# Patient Record
Sex: Male | Born: 1965 | Race: White | Hispanic: No | Marital: Married | Smoking: Never smoker
Health system: Southern US, Community
[De-identification: ages and names within clinical notes are randomized; demographics above are authoritative.]

## PROBLEM LIST (undated history)

## (undated) DIAGNOSIS — K219 Gastro-esophageal reflux disease without esophagitis: Secondary | ICD-10-CM

## (undated) DIAGNOSIS — I10 Essential (primary) hypertension: Secondary | ICD-10-CM

## (undated) DIAGNOSIS — N200 Calculus of kidney: Secondary | ICD-10-CM

## (undated) DIAGNOSIS — E7800 Pure hypercholesterolemia, unspecified: Secondary | ICD-10-CM

## (undated) DIAGNOSIS — I2510 Atherosclerotic heart disease of native coronary artery without angina pectoris: Secondary | ICD-10-CM

## (undated) HISTORY — PX: COLONOSCOPY: SHX174

## (undated) HISTORY — PX: UPPER GASTROINTESTINAL ENDOSCOPY: SHX188

## (undated) HISTORY — DX: Atherosclerotic heart disease of native coronary artery without angina pectoris: I25.10

## (undated) HISTORY — PX: NOSE SURGERY: SHX723

---

## 1997-11-20 ENCOUNTER — Emergency Department (HOSPITAL_COMMUNITY): Admission: EM | Admit: 1997-11-20 | Discharge: 1997-11-20 | Payer: Self-pay

## 1997-11-24 ENCOUNTER — Emergency Department (HOSPITAL_COMMUNITY): Admission: EM | Admit: 1997-11-24 | Discharge: 1997-11-24 | Payer: Self-pay

## 2001-01-31 ENCOUNTER — Encounter: Payer: Self-pay | Admitting: Family Medicine

## 2001-01-31 ENCOUNTER — Ambulatory Visit (HOSPITAL_COMMUNITY): Admission: RE | Admit: 2001-01-31 | Discharge: 2001-01-31 | Payer: Self-pay | Admitting: Family Medicine

## 2002-12-19 ENCOUNTER — Emergency Department (HOSPITAL_COMMUNITY): Admission: EM | Admit: 2002-12-19 | Discharge: 2002-12-19 | Payer: Self-pay | Admitting: Emergency Medicine

## 2004-01-21 ENCOUNTER — Ambulatory Visit: Payer: Self-pay | Admitting: Internal Medicine

## 2004-01-28 ENCOUNTER — Ambulatory Visit: Payer: Self-pay | Admitting: Internal Medicine

## 2004-02-05 ENCOUNTER — Ambulatory Visit: Payer: Self-pay | Admitting: Internal Medicine

## 2004-02-11 ENCOUNTER — Ambulatory Visit: Payer: Self-pay | Admitting: Internal Medicine

## 2004-02-18 ENCOUNTER — Ambulatory Visit: Payer: Self-pay | Admitting: Internal Medicine

## 2004-02-24 ENCOUNTER — Ambulatory Visit: Payer: Self-pay | Admitting: Internal Medicine

## 2004-03-03 ENCOUNTER — Ambulatory Visit: Payer: Self-pay | Admitting: Internal Medicine

## 2004-03-10 ENCOUNTER — Ambulatory Visit: Payer: Self-pay | Admitting: Internal Medicine

## 2004-03-17 ENCOUNTER — Ambulatory Visit: Payer: Self-pay | Admitting: Internal Medicine

## 2004-03-29 ENCOUNTER — Ambulatory Visit: Payer: Self-pay | Admitting: Internal Medicine

## 2004-04-06 ENCOUNTER — Encounter: Admission: RE | Admit: 2004-04-06 | Discharge: 2004-04-06 | Payer: Self-pay | Admitting: Family Medicine

## 2004-04-06 ENCOUNTER — Ambulatory Visit: Payer: Self-pay | Admitting: Family Medicine

## 2004-04-07 ENCOUNTER — Ambulatory Visit: Payer: Self-pay | Admitting: Internal Medicine

## 2004-04-08 ENCOUNTER — Ambulatory Visit: Payer: Self-pay | Admitting: Family Medicine

## 2004-04-13 ENCOUNTER — Ambulatory Visit: Payer: Self-pay | Admitting: Internal Medicine

## 2004-04-21 ENCOUNTER — Ambulatory Visit: Payer: Self-pay | Admitting: Internal Medicine

## 2004-04-29 ENCOUNTER — Ambulatory Visit: Payer: Self-pay | Admitting: Internal Medicine

## 2004-05-05 ENCOUNTER — Ambulatory Visit: Payer: Self-pay | Admitting: Internal Medicine

## 2004-05-11 ENCOUNTER — Ambulatory Visit: Payer: Self-pay | Admitting: Internal Medicine

## 2004-05-19 ENCOUNTER — Ambulatory Visit: Payer: Self-pay | Admitting: Internal Medicine

## 2004-05-27 ENCOUNTER — Ambulatory Visit: Payer: Self-pay | Admitting: Internal Medicine

## 2004-06-02 ENCOUNTER — Ambulatory Visit: Payer: Self-pay | Admitting: Internal Medicine

## 2004-06-09 ENCOUNTER — Ambulatory Visit: Payer: Self-pay | Admitting: Internal Medicine

## 2004-06-16 ENCOUNTER — Ambulatory Visit: Payer: Self-pay | Admitting: Internal Medicine

## 2004-06-22 ENCOUNTER — Ambulatory Visit: Payer: Self-pay | Admitting: Internal Medicine

## 2004-07-01 ENCOUNTER — Ambulatory Visit: Payer: Self-pay | Admitting: Internal Medicine

## 2004-07-06 ENCOUNTER — Ambulatory Visit: Payer: Self-pay | Admitting: Internal Medicine

## 2004-07-15 ENCOUNTER — Ambulatory Visit: Payer: Self-pay | Admitting: Internal Medicine

## 2004-07-22 ENCOUNTER — Ambulatory Visit: Payer: Self-pay | Admitting: Internal Medicine

## 2004-07-29 ENCOUNTER — Ambulatory Visit: Payer: Self-pay | Admitting: Internal Medicine

## 2004-08-04 ENCOUNTER — Ambulatory Visit: Payer: Self-pay | Admitting: Internal Medicine

## 2004-08-12 ENCOUNTER — Ambulatory Visit: Payer: Self-pay | Admitting: Internal Medicine

## 2004-08-19 ENCOUNTER — Ambulatory Visit: Payer: Self-pay | Admitting: Internal Medicine

## 2004-08-27 ENCOUNTER — Ambulatory Visit: Payer: Self-pay | Admitting: Internal Medicine

## 2004-09-02 ENCOUNTER — Ambulatory Visit: Payer: Self-pay | Admitting: Internal Medicine

## 2004-09-09 ENCOUNTER — Ambulatory Visit: Payer: Self-pay | Admitting: Internal Medicine

## 2004-09-17 ENCOUNTER — Ambulatory Visit: Payer: Self-pay | Admitting: Internal Medicine

## 2004-09-23 ENCOUNTER — Ambulatory Visit: Payer: Self-pay | Admitting: Internal Medicine

## 2004-09-30 ENCOUNTER — Ambulatory Visit: Payer: Self-pay | Admitting: Internal Medicine

## 2004-10-07 ENCOUNTER — Ambulatory Visit: Payer: Self-pay | Admitting: Internal Medicine

## 2004-10-13 ENCOUNTER — Ambulatory Visit: Payer: Self-pay | Admitting: Internal Medicine

## 2004-10-14 ENCOUNTER — Ambulatory Visit: Payer: Self-pay | Admitting: Internal Medicine

## 2004-10-20 ENCOUNTER — Ambulatory Visit: Payer: Self-pay | Admitting: Internal Medicine

## 2004-10-25 ENCOUNTER — Ambulatory Visit: Payer: Self-pay | Admitting: Internal Medicine

## 2004-11-05 ENCOUNTER — Ambulatory Visit: Payer: Self-pay | Admitting: Internal Medicine

## 2004-11-16 ENCOUNTER — Ambulatory Visit: Payer: Self-pay | Admitting: Internal Medicine

## 2004-12-01 ENCOUNTER — Ambulatory Visit: Payer: Self-pay | Admitting: Internal Medicine

## 2004-12-07 ENCOUNTER — Ambulatory Visit: Payer: Self-pay | Admitting: Internal Medicine

## 2004-12-16 ENCOUNTER — Ambulatory Visit: Payer: Self-pay | Admitting: Internal Medicine

## 2004-12-23 ENCOUNTER — Ambulatory Visit: Payer: Self-pay | Admitting: Internal Medicine

## 2005-01-04 ENCOUNTER — Ambulatory Visit: Payer: Self-pay | Admitting: Internal Medicine

## 2005-01-11 ENCOUNTER — Ambulatory Visit: Payer: Self-pay | Admitting: Internal Medicine

## 2005-01-19 ENCOUNTER — Ambulatory Visit: Payer: Self-pay | Admitting: Internal Medicine

## 2005-01-26 ENCOUNTER — Ambulatory Visit: Payer: Self-pay | Admitting: Internal Medicine

## 2005-02-04 ENCOUNTER — Ambulatory Visit: Payer: Self-pay | Admitting: Internal Medicine

## 2005-02-10 ENCOUNTER — Ambulatory Visit: Payer: Self-pay | Admitting: Internal Medicine

## 2005-02-16 ENCOUNTER — Ambulatory Visit: Payer: Self-pay | Admitting: Internal Medicine

## 2005-02-17 ENCOUNTER — Ambulatory Visit: Payer: Self-pay | Admitting: Internal Medicine

## 2005-02-23 ENCOUNTER — Ambulatory Visit: Payer: Self-pay | Admitting: Internal Medicine

## 2005-03-09 ENCOUNTER — Ambulatory Visit: Payer: Self-pay | Admitting: Internal Medicine

## 2005-03-17 ENCOUNTER — Ambulatory Visit: Payer: Self-pay | Admitting: Internal Medicine

## 2005-03-25 ENCOUNTER — Ambulatory Visit: Payer: Self-pay | Admitting: Internal Medicine

## 2005-05-11 ENCOUNTER — Ambulatory Visit: Payer: Self-pay | Admitting: Internal Medicine

## 2005-05-19 ENCOUNTER — Ambulatory Visit: Payer: Self-pay | Admitting: Internal Medicine

## 2005-06-02 ENCOUNTER — Ambulatory Visit: Payer: Self-pay | Admitting: Internal Medicine

## 2005-06-09 ENCOUNTER — Ambulatory Visit: Payer: Self-pay | Admitting: Internal Medicine

## 2005-06-23 ENCOUNTER — Ambulatory Visit: Payer: Self-pay | Admitting: Internal Medicine

## 2005-07-01 ENCOUNTER — Ambulatory Visit: Payer: Self-pay | Admitting: Internal Medicine

## 2005-07-11 ENCOUNTER — Ambulatory Visit: Payer: Self-pay | Admitting: Internal Medicine

## 2005-07-29 ENCOUNTER — Ambulatory Visit: Payer: Self-pay | Admitting: Internal Medicine

## 2005-08-12 ENCOUNTER — Ambulatory Visit: Payer: Self-pay | Admitting: Internal Medicine

## 2005-08-26 ENCOUNTER — Ambulatory Visit: Payer: Self-pay | Admitting: Internal Medicine

## 2005-09-01 ENCOUNTER — Ambulatory Visit: Payer: Self-pay | Admitting: Internal Medicine

## 2005-10-04 ENCOUNTER — Ambulatory Visit: Payer: Self-pay | Admitting: Internal Medicine

## 2005-11-29 ENCOUNTER — Ambulatory Visit: Payer: Self-pay | Admitting: Internal Medicine

## 2005-12-05 ENCOUNTER — Ambulatory Visit: Payer: Self-pay | Admitting: Internal Medicine

## 2005-12-14 ENCOUNTER — Ambulatory Visit: Payer: Self-pay | Admitting: Internal Medicine

## 2005-12-22 ENCOUNTER — Ambulatory Visit: Payer: Self-pay | Admitting: Internal Medicine

## 2005-12-29 ENCOUNTER — Ambulatory Visit: Payer: Self-pay | Admitting: Internal Medicine

## 2006-01-05 ENCOUNTER — Ambulatory Visit: Payer: Self-pay | Admitting: Internal Medicine

## 2006-01-06 ENCOUNTER — Ambulatory Visit: Payer: Self-pay | Admitting: Internal Medicine

## 2006-01-12 ENCOUNTER — Ambulatory Visit: Payer: Self-pay | Admitting: Internal Medicine

## 2006-01-20 ENCOUNTER — Ambulatory Visit: Payer: Self-pay | Admitting: Internal Medicine

## 2006-01-26 ENCOUNTER — Ambulatory Visit: Payer: Self-pay | Admitting: Internal Medicine

## 2006-02-03 ENCOUNTER — Ambulatory Visit: Payer: Self-pay | Admitting: Internal Medicine

## 2006-02-09 ENCOUNTER — Ambulatory Visit: Payer: Self-pay | Admitting: Internal Medicine

## 2006-02-16 ENCOUNTER — Ambulatory Visit: Payer: Self-pay | Admitting: Internal Medicine

## 2006-03-07 ENCOUNTER — Ambulatory Visit: Payer: Self-pay | Admitting: Internal Medicine

## 2006-03-17 ENCOUNTER — Emergency Department (HOSPITAL_COMMUNITY): Admission: EM | Admit: 2006-03-17 | Discharge: 2006-03-17 | Payer: Self-pay | Admitting: Emergency Medicine

## 2006-03-21 ENCOUNTER — Ambulatory Visit: Payer: Self-pay | Admitting: Internal Medicine

## 2006-04-04 ENCOUNTER — Ambulatory Visit: Payer: Self-pay | Admitting: Internal Medicine

## 2006-04-13 ENCOUNTER — Ambulatory Visit: Payer: Self-pay | Admitting: Internal Medicine

## 2006-04-25 ENCOUNTER — Ambulatory Visit: Payer: Self-pay | Admitting: Internal Medicine

## 2006-05-04 ENCOUNTER — Ambulatory Visit: Payer: Self-pay | Admitting: Internal Medicine

## 2006-05-12 ENCOUNTER — Ambulatory Visit: Payer: Self-pay | Admitting: Internal Medicine

## 2006-05-18 ENCOUNTER — Ambulatory Visit: Payer: Self-pay | Admitting: Internal Medicine

## 2006-05-25 ENCOUNTER — Ambulatory Visit: Payer: Self-pay | Admitting: Internal Medicine

## 2006-06-01 ENCOUNTER — Ambulatory Visit: Payer: Self-pay | Admitting: Internal Medicine

## 2006-06-16 ENCOUNTER — Ambulatory Visit: Payer: Self-pay | Admitting: Internal Medicine

## 2006-06-21 ENCOUNTER — Ambulatory Visit: Payer: Self-pay | Admitting: Internal Medicine

## 2006-06-22 ENCOUNTER — Ambulatory Visit: Payer: Self-pay | Admitting: Internal Medicine

## 2006-06-30 ENCOUNTER — Ambulatory Visit: Payer: Self-pay | Admitting: Internal Medicine

## 2006-07-07 ENCOUNTER — Ambulatory Visit: Payer: Self-pay | Admitting: Internal Medicine

## 2006-07-18 ENCOUNTER — Ambulatory Visit: Payer: Self-pay | Admitting: Internal Medicine

## 2006-07-27 ENCOUNTER — Ambulatory Visit: Payer: Self-pay | Admitting: Internal Medicine

## 2006-08-03 ENCOUNTER — Ambulatory Visit: Payer: Self-pay | Admitting: Internal Medicine

## 2006-08-10 ENCOUNTER — Ambulatory Visit: Payer: Self-pay | Admitting: Internal Medicine

## 2006-08-31 ENCOUNTER — Ambulatory Visit: Payer: Self-pay | Admitting: Internal Medicine

## 2006-09-14 ENCOUNTER — Ambulatory Visit: Payer: Self-pay | Admitting: Internal Medicine

## 2006-09-28 ENCOUNTER — Ambulatory Visit: Payer: Self-pay | Admitting: Internal Medicine

## 2006-10-12 ENCOUNTER — Ambulatory Visit: Payer: Self-pay | Admitting: Internal Medicine

## 2006-10-26 ENCOUNTER — Ambulatory Visit: Payer: Self-pay | Admitting: Internal Medicine

## 2006-11-16 ENCOUNTER — Ambulatory Visit: Payer: Self-pay | Admitting: Internal Medicine

## 2006-11-16 ENCOUNTER — Ambulatory Visit: Payer: Self-pay | Admitting: Pulmonary Disease

## 2006-11-30 ENCOUNTER — Ambulatory Visit: Payer: Self-pay | Admitting: Internal Medicine

## 2006-12-26 ENCOUNTER — Ambulatory Visit: Payer: Self-pay | Admitting: Internal Medicine

## 2007-01-05 ENCOUNTER — Ambulatory Visit: Payer: Self-pay | Admitting: Internal Medicine

## 2007-01-26 ENCOUNTER — Ambulatory Visit: Payer: Self-pay | Admitting: Internal Medicine

## 2007-02-08 ENCOUNTER — Ambulatory Visit: Payer: Self-pay | Admitting: Internal Medicine

## 2007-02-09 ENCOUNTER — Ambulatory Visit: Payer: Self-pay | Admitting: Internal Medicine

## 2007-02-28 ENCOUNTER — Ambulatory Visit: Payer: Self-pay | Admitting: Internal Medicine

## 2007-03-20 ENCOUNTER — Ambulatory Visit: Payer: Self-pay | Admitting: Internal Medicine

## 2007-04-10 ENCOUNTER — Ambulatory Visit: Payer: Self-pay | Admitting: Internal Medicine

## 2007-04-18 ENCOUNTER — Ambulatory Visit: Payer: Self-pay | Admitting: Internal Medicine

## 2007-04-25 ENCOUNTER — Ambulatory Visit: Payer: Self-pay | Admitting: Internal Medicine

## 2007-05-22 ENCOUNTER — Ambulatory Visit: Payer: Self-pay | Admitting: Internal Medicine

## 2007-05-23 ENCOUNTER — Telehealth (INDEPENDENT_AMBULATORY_CARE_PROVIDER_SITE_OTHER): Payer: Self-pay | Admitting: *Deleted

## 2007-07-02 ENCOUNTER — Ambulatory Visit: Payer: Self-pay | Admitting: Internal Medicine

## 2007-07-10 ENCOUNTER — Ambulatory Visit: Payer: Self-pay | Admitting: Internal Medicine

## 2007-07-18 ENCOUNTER — Ambulatory Visit: Payer: Self-pay | Admitting: Internal Medicine

## 2007-07-25 DIAGNOSIS — J45998 Other asthma: Secondary | ICD-10-CM | POA: Insufficient documentation

## 2007-07-25 DIAGNOSIS — J309 Allergic rhinitis, unspecified: Secondary | ICD-10-CM | POA: Insufficient documentation

## 2007-07-26 ENCOUNTER — Ambulatory Visit: Payer: Self-pay | Admitting: Internal Medicine

## 2007-08-07 ENCOUNTER — Ambulatory Visit: Payer: Self-pay | Admitting: Internal Medicine

## 2007-08-14 ENCOUNTER — Ambulatory Visit: Payer: Self-pay | Admitting: Internal Medicine

## 2007-08-20 ENCOUNTER — Ambulatory Visit: Payer: Self-pay | Admitting: Internal Medicine

## 2007-08-29 ENCOUNTER — Ambulatory Visit: Payer: Self-pay | Admitting: Internal Medicine

## 2007-09-06 ENCOUNTER — Ambulatory Visit: Payer: Self-pay | Admitting: Internal Medicine

## 2007-09-14 ENCOUNTER — Ambulatory Visit: Payer: Self-pay | Admitting: Internal Medicine

## 2007-09-19 ENCOUNTER — Ambulatory Visit: Payer: Self-pay | Admitting: Internal Medicine

## 2007-09-28 ENCOUNTER — Ambulatory Visit: Payer: Self-pay | Admitting: Internal Medicine

## 2007-10-05 ENCOUNTER — Ambulatory Visit: Payer: Self-pay | Admitting: Internal Medicine

## 2007-10-17 ENCOUNTER — Ambulatory Visit: Payer: Self-pay | Admitting: Internal Medicine

## 2007-10-18 ENCOUNTER — Ambulatory Visit: Payer: Self-pay | Admitting: Internal Medicine

## 2007-10-24 ENCOUNTER — Ambulatory Visit: Payer: Self-pay | Admitting: Internal Medicine

## 2007-10-29 ENCOUNTER — Ambulatory Visit: Payer: Self-pay | Admitting: Internal Medicine

## 2007-11-01 ENCOUNTER — Ambulatory Visit: Payer: Self-pay | Admitting: Pulmonary Disease

## 2007-11-13 ENCOUNTER — Ambulatory Visit: Payer: Self-pay | Admitting: Internal Medicine

## 2007-11-20 ENCOUNTER — Ambulatory Visit: Payer: Self-pay | Admitting: Internal Medicine

## 2007-11-27 ENCOUNTER — Ambulatory Visit: Payer: Self-pay | Admitting: Internal Medicine

## 2007-12-06 ENCOUNTER — Telehealth (INDEPENDENT_AMBULATORY_CARE_PROVIDER_SITE_OTHER): Payer: Self-pay | Admitting: *Deleted

## 2007-12-06 ENCOUNTER — Ambulatory Visit: Payer: Self-pay | Admitting: Internal Medicine

## 2007-12-25 ENCOUNTER — Ambulatory Visit: Payer: Self-pay | Admitting: Internal Medicine

## 2007-12-31 ENCOUNTER — Ambulatory Visit: Payer: Self-pay | Admitting: Internal Medicine

## 2008-01-08 ENCOUNTER — Ambulatory Visit: Payer: Self-pay | Admitting: Internal Medicine

## 2008-02-05 ENCOUNTER — Ambulatory Visit: Payer: Self-pay | Admitting: Internal Medicine

## 2008-02-12 ENCOUNTER — Ambulatory Visit: Payer: Self-pay | Admitting: Internal Medicine

## 2008-03-06 ENCOUNTER — Ambulatory Visit: Payer: Self-pay | Admitting: Internal Medicine

## 2008-03-18 ENCOUNTER — Ambulatory Visit: Payer: Self-pay | Admitting: Internal Medicine

## 2008-04-01 ENCOUNTER — Ambulatory Visit: Payer: Self-pay | Admitting: Internal Medicine

## 2008-04-15 ENCOUNTER — Ambulatory Visit: Payer: Self-pay | Admitting: Internal Medicine

## 2008-04-22 ENCOUNTER — Ambulatory Visit: Payer: Self-pay | Admitting: Internal Medicine

## 2008-05-01 ENCOUNTER — Ambulatory Visit: Payer: Self-pay | Admitting: Internal Medicine

## 2008-05-09 ENCOUNTER — Ambulatory Visit: Payer: Self-pay | Admitting: Internal Medicine

## 2008-05-12 ENCOUNTER — Ambulatory Visit: Payer: Self-pay | Admitting: Internal Medicine

## 2008-05-19 ENCOUNTER — Ambulatory Visit: Payer: Self-pay | Admitting: Internal Medicine

## 2008-05-28 ENCOUNTER — Ambulatory Visit: Payer: Self-pay | Admitting: Internal Medicine

## 2008-06-06 ENCOUNTER — Ambulatory Visit: Payer: Self-pay | Admitting: Internal Medicine

## 2008-06-13 ENCOUNTER — Ambulatory Visit: Payer: Self-pay | Admitting: Internal Medicine

## 2008-06-23 ENCOUNTER — Ambulatory Visit: Payer: Self-pay | Admitting: Internal Medicine

## 2008-07-09 ENCOUNTER — Ambulatory Visit: Payer: Self-pay | Admitting: Internal Medicine

## 2008-07-22 ENCOUNTER — Ambulatory Visit: Payer: Self-pay | Admitting: Internal Medicine

## 2008-07-28 ENCOUNTER — Encounter: Payer: Self-pay | Admitting: Internal Medicine

## 2008-07-31 ENCOUNTER — Ambulatory Visit: Payer: Self-pay | Admitting: Internal Medicine

## 2008-08-04 ENCOUNTER — Ambulatory Visit: Payer: Self-pay | Admitting: Internal Medicine

## 2008-08-20 ENCOUNTER — Ambulatory Visit: Payer: Self-pay | Admitting: Internal Medicine

## 2008-09-02 ENCOUNTER — Ambulatory Visit: Payer: Self-pay | Admitting: Internal Medicine

## 2008-09-11 ENCOUNTER — Ambulatory Visit: Payer: Self-pay | Admitting: Internal Medicine

## 2008-09-23 ENCOUNTER — Ambulatory Visit: Payer: Self-pay | Admitting: Internal Medicine

## 2008-10-03 ENCOUNTER — Ambulatory Visit: Payer: Self-pay | Admitting: Internal Medicine

## 2008-10-21 ENCOUNTER — Ambulatory Visit: Payer: Self-pay | Admitting: Internal Medicine

## 2008-10-29 ENCOUNTER — Ambulatory Visit: Payer: Self-pay | Admitting: Internal Medicine

## 2008-11-06 ENCOUNTER — Ambulatory Visit: Payer: Self-pay | Admitting: Internal Medicine

## 2008-11-13 ENCOUNTER — Ambulatory Visit: Payer: Self-pay | Admitting: Internal Medicine

## 2008-12-02 ENCOUNTER — Ambulatory Visit: Payer: Self-pay | Admitting: Internal Medicine

## 2008-12-22 ENCOUNTER — Ambulatory Visit: Payer: Self-pay | Admitting: Internal Medicine

## 2008-12-23 ENCOUNTER — Ambulatory Visit: Payer: Self-pay | Admitting: Internal Medicine

## 2009-01-05 ENCOUNTER — Ambulatory Visit: Payer: Self-pay | Admitting: Internal Medicine

## 2009-01-23 ENCOUNTER — Ambulatory Visit: Payer: Self-pay | Admitting: Internal Medicine

## 2009-04-14 ENCOUNTER — Encounter: Payer: Self-pay | Admitting: Internal Medicine

## 2009-04-20 ENCOUNTER — Ambulatory Visit: Payer: Self-pay | Admitting: Internal Medicine

## 2009-05-11 ENCOUNTER — Ambulatory Visit: Payer: Self-pay | Admitting: Internal Medicine

## 2009-06-10 ENCOUNTER — Ambulatory Visit: Payer: Self-pay | Admitting: Internal Medicine

## 2009-07-17 ENCOUNTER — Ambulatory Visit: Payer: Self-pay | Admitting: Internal Medicine

## 2009-07-17 ENCOUNTER — Telehealth (INDEPENDENT_AMBULATORY_CARE_PROVIDER_SITE_OTHER): Payer: Self-pay | Admitting: *Deleted

## 2009-08-23 ENCOUNTER — Emergency Department (HOSPITAL_COMMUNITY): Admission: EM | Admit: 2009-08-23 | Discharge: 2009-08-23 | Payer: Self-pay | Admitting: Emergency Medicine

## 2009-08-27 ENCOUNTER — Ambulatory Visit: Payer: Self-pay | Admitting: Internal Medicine

## 2009-09-09 ENCOUNTER — Encounter (INDEPENDENT_AMBULATORY_CARE_PROVIDER_SITE_OTHER): Payer: Self-pay | Admitting: *Deleted

## 2009-09-10 ENCOUNTER — Ambulatory Visit: Payer: Self-pay | Admitting: Internal Medicine

## 2009-09-28 ENCOUNTER — Ambulatory Visit: Payer: Self-pay | Admitting: Internal Medicine

## 2009-09-30 ENCOUNTER — Encounter: Payer: Self-pay | Admitting: Cardiology

## 2009-09-30 ENCOUNTER — Ambulatory Visit: Payer: Self-pay | Admitting: Cardiology

## 2009-09-30 ENCOUNTER — Ambulatory Visit: Payer: Self-pay

## 2009-09-30 ENCOUNTER — Encounter (HOSPITAL_COMMUNITY): Admission: RE | Admit: 2009-09-30 | Discharge: 2009-11-18 | Payer: Self-pay | Admitting: Internal Medicine

## 2009-09-30 ENCOUNTER — Telehealth (INDEPENDENT_AMBULATORY_CARE_PROVIDER_SITE_OTHER): Payer: Self-pay | Admitting: Cardiology

## 2009-09-30 DIAGNOSIS — E785 Hyperlipidemia, unspecified: Secondary | ICD-10-CM | POA: Insufficient documentation

## 2009-09-30 LAB — CONVERTED CEMR LAB
Direct LDL: 169.3 mg/dL
HDL: 54.4 mg/dL (ref >39.00)

## 2009-10-16 ENCOUNTER — Ambulatory Visit: Payer: Self-pay | Admitting: Internal Medicine

## 2009-11-02 ENCOUNTER — Ambulatory Visit: Payer: Self-pay | Admitting: Internal Medicine

## 2009-11-17 ENCOUNTER — Ambulatory Visit: Payer: Self-pay | Admitting: Internal Medicine

## 2009-11-25 ENCOUNTER — Ambulatory Visit: Payer: Self-pay | Admitting: Internal Medicine

## 2009-12-09 ENCOUNTER — Ambulatory Visit: Payer: Self-pay | Admitting: Internal Medicine

## 2009-12-24 ENCOUNTER — Ambulatory Visit: Payer: Self-pay | Admitting: Internal Medicine

## 2010-01-04 ENCOUNTER — Ambulatory Visit: Payer: Self-pay | Admitting: Internal Medicine

## 2010-02-16 NOTE — Miscellaneous (Signed)
Summary: Injection Record / [facility]    Injection Record /  Allergy    Imported By: Lennie Odor 09/18/2009 09:49:05  _____________________________________________________________________  External Attachment:    Type:   Image     Comment:   External Document

## 2010-02-16 NOTE — Assessment & Plan Note (Signed)
Summary: Cardiology Nuclear Testing  Nuclear Med Background Indications for Stress Test: Evaluation for Ischemia, Post Hospital  Indications Comments: 8/07/11[facility] ER- Chest pain- negative enzymes  History: Asthma, GXT  History Comments: 09/30/09 AM- Positive GXT with STdepression and throat tightness.  Symptoms: Chest Pain    Nuclear Pre-Procedure Cardiac Risk Factors: Family History - CAD Caffeine/Decaff Intake: None NPO After: 9:30 AM Lungs: clear IV 0.9% NS with Angio Cath: 22g     IV Site: R Hand IV Started by: Bonnita Levan, RN Chest Size (in) 42     Height (in): 73 Weight (lb): 193 BMI: 25.56  Nuclear Med Study 1 or 2 day study:  1 day     Stress Test Type:  Stress Reading MD:  Willa Rough, MD     Referring MD:  D.Bensimhon Resting Radionuclide:  Technetium 37m Tetrofosmin     Resting Radionuclide Dose:  11.0 mCi  Stress Radionuclide:  Technetium 39m Tetrofosmin     Stress Radionuclide Dose:  33.0 mCi   Stress Protocol Exercise Time (min):  9:00 min     Max HR:  162 bpm     Predicted Max HR:  178 bpm  Max Systolic BP: 216 mm Hg     Percent Max HR:  91.01 %     METS: 10.4 Rate Pressure Product:  [address]    Stress Test Technologist:  Milana Na, EMT-P     Nuclear Technologist:  Harlow Asa, CNMT  Rest Procedure  Myocardial perfusion imaging was performed at rest 45 minutes following the intravenous administration of Technetium 33m Tetrofosmin.  Stress Procedure  The patient exercised for 9:00. The patient stopped due to fatigue and chest pain.  There were non specific ST-T wave changes.  Technetium 40m Tetrofosmin was injected at peak exercise and myocardial perfusion imaging was performed after a brief delay.  QPS Raw Data Images:  Patient motion noted; appropriate software correction applied. Stress Images:  Normal homogeneous uptake in all areas of the myocardium. Rest Images:  Normal homogeneous uptake in all areas of the myocardium. Subtraction (SDS):   No evidence of ischemia. Transient Ischemic Dilatation:  1.09  (Normal <1.22)  Lung/Heart Ratio:  .28  (Normal <0.45)  Quantitative Gated Spect Images QGS EDV:  107 ml QGS ESV:  36 ml QGS EF:  66 % QGS cine images:  normal motion  Findings Normal nuclear study      Overall Impression  Exercise Capacity: Good exercise capacity. BP Response: Normal blood pressure response. Clinical Symptoms: fatigue and chest pain ECG Impression: ST depression present Overall Impression: Normal stress nuclear study.  Appended Document: Cardiology Nuclear Testing normal  Appended Document: Cardiology Nuclear Testing pt aware

## 2010-02-16 NOTE — Assessment & Plan Note (Signed)
Summary: flu shot/mhh  Nurse Visit   Allergies: No Known Drug Allergies  Orders Added: 1)  Admin 1st Vaccine [90471] 2)  Flu Vaccine 31yrs + [[address]010] Flu Vaccine Consent Questions     Do you have a history of severe allergic reactions to this vaccine? no    Any prior history of allergic reactions to egg and/or gelatin? no    Do you have a sensitivity to the preservative Thimersol? no    Do you have a past history of Guillan-Barre Syndrome? no    Do you currently have an acute febrile illness? no    Have you ever had a severe reaction to latex? no    Vaccine information given and explained to patient? yes    Are you currently pregnant? no    Lot Number:[id number]   Exp Date:07/17/2010   Site Given  Left Deltoid IM-CCC]  Tammy Scott  December 02, 2009 9:11 AM

## 2010-02-16 NOTE — Miscellaneous (Signed)
Summary: rx changed from Nasonex to Fluticasone  Clinical Lists Changes received STEP THERAPY REQUEST FORM from [facility] indicating Nasonex not covered without prior auth.  CY ok'd to change pt to Fluticasone 2 puffs in each nostril once daily #1 x 11 refills. Arman Filter LPN  April 14, 2009 11:00 AM  Medications: Rx of FLUTICASONE PROPIONATE 50 MCG/ACT SUSP (FLUTICASONE PROPIONATE) 1 to 2 sprays each nostril once daily;  #1 x 11;  Signed;  Entered by: Arman Filter LPN;  Authorized by: Waymon Budge MD;  Method used: Telephoned to [facility], [address] [address], [place], [place]  [address], Ph: [phone], Fax: [phone]    Prescriptions: FLUTICASONE PROPIONATE 50 MCG/ACT SUSP (FLUTICASONE PROPIONATE) 1 to 2 sprays each nostril once daily  #1 x 11   Entered by:   Arman Filter LPN   Authorized by:   Waymon Budge MD   Signed by:   Arman Filter LPN on 78/46/9629   Method used:   Telephoned to ...       [facility] Environmental education officer)       [address] [address]       [place], [place]  [address]       Ph: [phone]       Fax: [phone]   RxID:   [id number]  rx sent by fax to pharmacy.  Aundra Millet Reynolds LPN  April 14, 2009 11:00 AM

## 2010-02-16 NOTE — Progress Notes (Signed)
  Phone Note Outgoing Call   Call placed by: Whitney Maeola Sarah RN,  September 30, 2009 10:03 AM Call placed to: Patient Summary of Call: Herma Carson, PA, wanted to switch ALTACE from the 5mg  tablet to 2.5mg  tablet by mouth daily.  Follow-up for Phone Call        Spoke to pharmacist, Elijah Birk, at the [facility] and changed the medication to 2.5mg  by mouth daily since the 5mg  tablets were in capsule form. Whitney Maeola Sarah RN  September 30, 2009 10:12 AM

## 2010-02-16 NOTE — Letter (Signed)
Summary: Appointment - Reschedule  [facility], Main Office  [address]   [place], [place] [address]   Phone: [phone]  Fax: [phone]     September 09, 2009 MRN: [id number]   Kurt Walker [address] [place], [place]  [address]   Dear [name],   Due to a change in our office schedule, your appointment on August 30,2011 at 11:15 must be changed.  It is very important that we reach you to reschedule this appointment. We look forward to participating in your health care needs. Please contact us at the number listed above at your earliest convenience to reschedule this appointment.     Sincerely, Artist

## 2010-02-16 NOTE — Miscellaneous (Signed)
Summary: Injection Record/[facility]  Injection Record/[facility]   Imported By: Sherian Rein 06/09/2009 13:05:00  _____________________________________________________________________  External Attachment:    Type:   Image     Comment:   External Document

## 2010-02-16 NOTE — Progress Notes (Signed)
[facility]  Phone Note Call from Patient   Caller: Patient Call For: young Summary of Call: need refill for advair 250/50 [facility] [phone] Initial call taken by: Rickard Patience,  July 17, 2009 10:08 AM    Prescriptions: ADVAIR DISKUS 250-50 MCG/DOSE MISC (FLUTICASONE-SALMETEROL) once daily  #3 x 0   Entered by:   Kandice Hams CMA   Authorized by:   Waymon Budge MD   Signed by:   Kandice Hams CMA on 07/17/2009   Method used:   Printed then faxed to ...       [facility]) (mail-order)             ,          Ph: [phone]       Fax: [phone]   RxID:   [id number]

## 2010-02-18 NOTE — Assessment & Plan Note (Signed)
Summary: per pt call/cb   Primary Provider/Referring Provider:  None  CC:  1 year f/u. pt has no complaints.  History of Present Illness: CC:  Pt here for yearly follow up. No new problems or concerns.  History of Present Illness: 07/26/07-45 year old man returning for follow-up of allergic rhinitis and asthma.  He had dropped off of medications during an insurance gap.  He wants to restart now.  Noticing some sneeze at work and at night.  He felt good control on allergy vaccine, Nasonex, plus, Advair.  Advair was sufficient and used once daily.  He also complains of halitosis.  Denies purulent discharge, headache, fever.  08-07-08 Asthma, allergic rhinitis Allergy vaccine "absolutely" helps, with no problems or concerns. He uses Advair once daily and is happy with this.  January 04, 2010- Asthma, allergic rhinitis Nurse-CC: 1 year f/u. pt has no complaints He continues to do well with allergy vaccine. Describes this as a good year. Uses Advair and fluticasone  each once daily. Rarely uses a rescue inhaler if exposed to scented candles or exercising in cold air. Uses his wife's rescue inhaler. Discussed symptoms that might be blamed on ramipril, to watch for.  Had flu vax.     Asthma History    Initial Asthma Severity Rating:    Age range: 12+ years    Symptoms: 0-2 days/week    Nighttime Awakenings: 0-2/month    Interferes w/ normal activity: no limitations    SABA use (not for EIB): 0-2 days/week    Asthma Severity Assessment: Intermittent   Preventive Screening-Counseling & Management  Alcohol-Tobacco     Smoking Status: never  Current Medications (verified): 1)  Advair Diskus 250-50 Mcg/dose Misc (Fluticasone-Salmeterol) .... Once Daily 2)  Fluticasone Propionate 50 Mcg/act Susp (Fluticasone Propionate) .Marland Kitchen.. 1 To 2 Sprays Each Nostril Once Daily 3)  Allergy Vaccine 1:10 Gh .... As Directed 4)  Ramipril 5 Mg Caps (Ramipril) .... Once Daily  Allergies (verified): No Known  Drug Allergies  Past History:  Past Medical History: Last updated: 07/25/2007 Allergic Rhinitis Asthma  Past Surgical History: Last updated: 08-07-08 Nose- reset fx and cosmetic  Family History: Last updated: 08/07/2008 Mother- died MI age 62  Social History: Last updated: 01/04/2010 Remarried, son and daughter Patient never smoked.  Self employed- Facilities manager  Risk Factors: Smoking Status: never (01/04/2010)  Social History: Remarried, son and daughter Patient never smoked.  Self employed- Facilities manager  Review of Systems      See HPI       The patient complains of nasal congestion/difficulty breathing through nose.  The patient denies shortness of breath with activity, shortness of breath at rest, productive cough, non-productive cough, coughing up blood, chest pain, irregular heartbeats, acid heartburn, indigestion, loss of appetite, weight change, abdominal pain, difficulty swallowing, sore throat, tooth/dental problems, headaches, and sneezing.    Vital Signs:  Patient profile:   45 year old male Height:      73 inches Weight:      199 pounds O2 Sat:      98 % on Room air Pulse rate:   69 / minute BP sitting:   132 / 80  (left arm) Cuff size:   large  Vitals Entered By: Carver Fila (January 04, 2010 4:01 PM)  O2 Flow:  Room air CC: 1 year f/u. pt has no complaints Comments meds and allergies updated Phone number updated Carver Fila  January 04, 2010 4:01 PM  Physical Exam  Additional Exam:  General: A/Ox3; pleasant and cooperative, NAD, trim, well - appearing SKIN: no rash, lesions NODES: no lymphadenopathy HEENT: [place]/AT, EOM- WNL, Conjuctivae- clear, PERRLA, TM-WNL, Nose- clear, Throat- clear and wnl, Mallampati  II NECK: Supple w/ fair ROM, JVD- none, normal carotid impulses w/o bruits Thyroid-  CHEST: Clear to P&A HEART: RRR, no m/g/r heard ABDOMEN: Soft and nl;  WUJ:WJXB, nl pulses, no  edema  NEURO: Grossly intact to observation      Impression & Recommendations:  Problem # 1:  ALLERGIC RHINITIS (ICD-477.9)  He is doing very well with no changes needed.  His updated medication list for this problem includes:    Fluticasone Propionate 50 Mcg/act Susp (Fluticasone propionate) .Marland Kitchen... 1 to 2 sprays each nostril once daily  Problem # 2:  ASTHMA (ICD-493.90) Good control. We will get him his own rescue inhaler   Medications Added to Medication List This Visit: 1)  Ramipril 5 Mg Caps (Ramipril) .... Once daily 2)  Proair Hfa 108 (90 Base) Mcg/act Aers (Albuterol sulfate) .... 2 puffs four times a day as needed rescue inhaler  Other Orders: Est. Patient Level III ([address])  Patient Instructions: 1)  Please schedule a follow-up appointment in 1 year. 2)  Script for [employer] rescue inhaler to keep available 3)  Continue present treeatment. Please call as needed.  Prescriptions: PROAIR HFA 108 (90 BASE) MCG/ACT AERS (ALBUTEROL SULFATE) 2 puffs four times a day as needed rescue inhaler  #1 x prn   Entered and Authorized by:   Waymon Budge MD   Signed by:   Waymon Budge MD on 01/04/2010   Method used:   Print then Give to Patient   RxID:   [id number]

## 2010-03-22 ENCOUNTER — Encounter: Payer: Self-pay | Admitting: Internal Medicine

## 2010-03-22 ENCOUNTER — Ambulatory Visit (INDEPENDENT_AMBULATORY_CARE_PROVIDER_SITE_OTHER): Payer: Managed Care, Other (non HMO)

## 2010-03-22 DIAGNOSIS — J301 Allergic rhinitis due to pollen: Secondary | ICD-10-CM | POA: Insufficient documentation

## 2010-03-30 NOTE — Assessment & Plan Note (Signed)
Summary: ALLERGY/CB   Nurse Visit   Allergies: No Known Drug Allergies  Orders Added: 1)  Allergy Injection (1) [[address]] 

## 2010-04-02 LAB — DIFFERENTIAL
Basophils Relative: 0 % (ref 0–1)
Eosinophils Relative: 3 % (ref 0–5)
Monocytes Absolute: 0.4 10*3/uL (ref 0.1–1.0)
Monocytes Relative: 8 % (ref 3–12)
Neutro Abs: 2.7 10*3/uL (ref 1.7–7.7)

## 2010-04-02 LAB — URINALYSIS, ROUTINE W REFLEX MICROSCOPIC
Bilirubin Urine: NEGATIVE
Ketones, ur: NEGATIVE mg/dL
Protein, ur: NEGATIVE mg/dL
Urobilinogen, UA: 1 mg/dL (ref 0.0–1.0)
pH: 7 (ref 5.0–8.0)

## 2010-04-02 LAB — COMPREHENSIVE METABOLIC PANEL
ALT: 17 U/L (ref 0–53)
Alkaline Phosphatase: 63 U/L (ref 39–117)
BUN: 12 mg/dL (ref 6–23)
Creatinine, Ser: 0.9 mg/dL (ref 0.4–1.5)
GFR calc non Af Amer: >60 mL/min (ref >60)
Glucose, Bld: 77 mg/dL (ref 70–99)
Potassium: 4 mEq/L (ref 3.5–5.1)

## 2010-04-02 LAB — CBC
HCT: 43.7 % (ref 39.0–52.0)
Hemoglobin: 15.3 g/dL (ref 13.0–17.0)
MCH: 31.9 pg (ref 26.0–34.0)
MCV: 91.2 fL (ref 78.0–100.0)
Platelets: 213 10*3/uL (ref 150–400)
RBC: 4.79 MIL/uL (ref 4.22–5.81)
RDW: 13 % (ref 11.5–15.5)
WBC: 5.5 10*3/uL (ref 4.0–10.5)

## 2010-04-02 LAB — POCT CARDIAC MARKERS
Myoglobin, poc: 56.3 ng/mL (ref 12–200)
Troponin i, poc: <0.05 ng/mL (ref 0.00–0.09)

## 2010-04-06 ENCOUNTER — Ambulatory Visit (INDEPENDENT_AMBULATORY_CARE_PROVIDER_SITE_OTHER): Payer: Managed Care, Other (non HMO)

## 2010-04-06 DIAGNOSIS — J301 Allergic rhinitis due to pollen: Secondary | ICD-10-CM

## 2010-05-17 ENCOUNTER — Ambulatory Visit (INDEPENDENT_AMBULATORY_CARE_PROVIDER_SITE_OTHER): Payer: Managed Care, Other (non HMO)

## 2010-05-17 DIAGNOSIS — J309 Allergic rhinitis, unspecified: Secondary | ICD-10-CM

## 2010-06-01 ENCOUNTER — Ambulatory Visit (INDEPENDENT_AMBULATORY_CARE_PROVIDER_SITE_OTHER): Payer: Managed Care, Other (non HMO)

## 2010-06-01 DIAGNOSIS — J309 Allergic rhinitis, unspecified: Secondary | ICD-10-CM

## 2010-06-16 ENCOUNTER — Ambulatory Visit (INDEPENDENT_AMBULATORY_CARE_PROVIDER_SITE_OTHER): Payer: Managed Care, Other (non HMO)

## 2010-06-16 DIAGNOSIS — J309 Allergic rhinitis, unspecified: Secondary | ICD-10-CM

## 2010-06-18 ENCOUNTER — Encounter: Payer: Self-pay | Admitting: Internal Medicine

## 2010-07-19 ENCOUNTER — Encounter: Payer: Self-pay | Admitting: Cardiology

## 2010-07-30 ENCOUNTER — Ambulatory Visit (INDEPENDENT_AMBULATORY_CARE_PROVIDER_SITE_OTHER): Payer: Managed Care, Other (non HMO)

## 2010-07-30 DIAGNOSIS — J309 Allergic rhinitis, unspecified: Secondary | ICD-10-CM

## 2010-10-22 ENCOUNTER — Telehealth: Payer: Self-pay | Admitting: Internal Medicine

## 2010-10-22 MED ORDER — FLUTICASONE-SALMETEROL 250-50 MCG/DOSE IN AEPB: 1.0000 | INHALATION_SPRAY | Freq: Every day | RESPIRATORY_TRACT | Status: DC

## 2010-10-22 NOTE — Telephone Encounter (Signed)
Called, spoke with pt.  He is almost out of advair 250/50.  States he takes this once daily.  He is requesting a 90 day rx to be sent to [facility] along with 30 day rx to be sent to [facility] on [address] to last until he receives the mail rx.  Advised I would send in rxs for him.  He was last seen by CDY on 12/2009 and has no pending appts - I attempted to have pt schedule his yearly follow up but he is requesting to call back later to schedule this.

## 2011-05-31 ENCOUNTER — Telehealth: Payer: Self-pay | Admitting: Internal Medicine

## 2011-05-31 MED ORDER — FLUTICASONE PROPIONATE 50 MCG/ACT NA SUSP: 2.0000 | Freq: Every day | NASAL | Status: DC

## 2011-05-31 MED ORDER — FLUTICASONE-SALMETEROL 250-50 MCG/DOSE IN AEPB: 1.0000 | INHALATION_SPRAY | Freq: Every day | RESPIRATORY_TRACT | Status: DC

## 2011-05-31 NOTE — Telephone Encounter (Signed)
Pt scheduled appt for 6/19.Kurt Walker

## 2011-05-31 NOTE — Telephone Encounter (Signed)
lmomtcb x1 

## 2011-05-31 NOTE — Telephone Encounter (Signed)
Pt is aware his flonase and advair has been sent to the pharmacy. Nothing further was needed

## 2011-05-31 NOTE — Telephone Encounter (Signed)
Pt last seen Dec 2011 and has no pending appt's Needs ov- lmtcb

## 2011-05-31 NOTE — Telephone Encounter (Signed)
Pt returned triage's call.  Holly D Pryor ° °

## 2011-06-03 ENCOUNTER — Telehealth: Payer: Self-pay | Admitting: Internal Medicine

## 2011-06-03 NOTE — Telephone Encounter (Signed)
I spoke with Kurt Walker and advised her pt takes the advair 1 puff QD. She voiced her understanding and needed nothing further

## 2011-07-06 ENCOUNTER — Ambulatory Visit: Payer: Managed Care, Other (non HMO) | Admitting: Internal Medicine

## 2011-07-14 ENCOUNTER — Telehealth: Payer: Self-pay | Admitting: Internal Medicine

## 2011-07-14 NOTE — Telephone Encounter (Signed)
Pt last 1:10 vial made 05-22-10 pt dropped off vaccine

## 2011-08-30 ENCOUNTER — Encounter: Payer: Self-pay | Admitting: Internal Medicine

## 2011-11-03 ENCOUNTER — Telehealth: Payer: Self-pay | Admitting: Internal Medicine

## 2011-11-03 MED ORDER — FLUTICASONE-SALMETEROL 250-50 MCG/DOSE IN AEPB: 1.0000 | INHALATION_SPRAY | Freq: Every day | RESPIRATORY_TRACT | Status: DC

## 2011-11-03 NOTE — Telephone Encounter (Signed)
Per CY-okay to give local Rx for #1 with 2 refills.

## 2011-11-03 NOTE — Telephone Encounter (Signed)
LMOMTCB x 1 

## 2011-11-03 NOTE — Telephone Encounter (Signed)
Called [facility] pharmacy, they are currently closed for lunch.  LMOM for Courtney and gave instructions for advair usage per patients chart.  Informed on message that if this was not enough information to please give our office a call back. Nothing further at this time.

## 2011-11-03 NOTE — Telephone Encounter (Signed)
Ok per CY to call in Advair 250-50 #1 with 2 refills sent to local [facility] [address].  unable to reach patient--left msg on machine with details of medication called to pharm.

## 2011-11-03 NOTE — Telephone Encounter (Signed)
Pt returned triage's call.  Holly D Pryor ° °

## 2011-12-23 ENCOUNTER — Encounter: Payer: Self-pay | Admitting: Internal Medicine

## 2011-12-23 ENCOUNTER — Ambulatory Visit (INDEPENDENT_AMBULATORY_CARE_PROVIDER_SITE_OTHER): Payer: BC Managed Care – PPO | Admitting: Internal Medicine

## 2011-12-23 VITALS — BP 122/88 | HR 81 | Ht 73.0 in | Wt 192.8 lb

## 2011-12-23 DIAGNOSIS — J45909 Unspecified asthma, uncomplicated: Secondary | ICD-10-CM

## 2011-12-23 DIAGNOSIS — J301 Allergic rhinitis due to pollen: Secondary | ICD-10-CM

## 2011-12-23 DIAGNOSIS — J45998 Other asthma: Secondary | ICD-10-CM

## 2011-12-23 MED ORDER — ALBUTEROL SULFATE HFA 108 (90 BASE) MCG/ACT IN AERS: 2.0000 | INHALATION_SPRAY | RESPIRATORY_TRACT | Status: DC | PRN

## 2011-12-23 MED ORDER — FLUTICASONE PROPIONATE 50 MCG/ACT NA SUSP: 2.0000 | Freq: Every day | NASAL | Status: DC

## 2011-12-23 MED ORDER — FLUTICASONE-SALMETEROL 250-50 MCG/DOSE IN AEPB: 1.0000 | INHALATION_SPRAY | Freq: Every day | RESPIRATORY_TRACT | Status: DC

## 2011-12-23 NOTE — Patient Instructions (Addendum)
Refill flonase, Advair, Proair   sent  Finish the Augmentin. Ok to call us next week if you aren't cleared up.  Please call as needed

## 2011-12-23 NOTE — Progress Notes (Signed)
12/23/11- 45 yoM never smoker coming to reestablish for allergic rhinitis and allergic asthma.   No PCP Previously a patient here, last seen 01/04/2010. He was doing very well with allergy vaccine but dropped off about a year ago. Since then had a sinusitis treated with Augmentin. Does not recognize significant seasonal nasal symptoms as long as he continues Flonase. No headaches or epistaxis. Asthma control has been satisfactory continuing Advair 250 once daily. He does find that he needs to stay on at least that much. He also wants to refill a rescue inhaler. Irritants have been his primary asthma trigger.  ROS-see HPI Constitutional:   No-   weight loss, night sweats, fevers, chills, fatigue, lassitude. HEENT:   No-  headaches, difficulty swallowing, tooth/dental problems, sore throat,       No-  sneezing, itching, ear ache, nasal congestion, post nasal drip,  CV:  No-   chest pain, orthopnea, PND, swelling in lower extremities, anasarca, dizziness, palpitations Resp: No-   shortness of breath with exertion or at rest.              No-   productive cough,  No non-productive cough,  No- coughing up of blood.              No-   change in color of mucus.  No- wheezing.   Skin: No-   rash or lesions. GI:  No-   heartburn, indigestion, abdominal pain, nausea, vomiting,  GU:  MS:  No-   joint pain or swelling.   Neuro-     nothing unusual Psych:  No- change in mood or affect. No depression or anxiety.  No memory loss.  OBJ- Physical Exam General- Alert, Oriented, Affect-appropriate, Distress- none acute Skin- rash-none, lesions- none, excoriation- none Lymphadenopathy- none Head- atraumatic            Eyes- Gross vision intact, PERRLA, conjunctivae and secretions clear            Ears- Hearing, canals-normal            Nose- + sniffing, turbinate edema especially on the right, no-Septal dev,  polyps, erosion, perforation             Throat- Mallampati II , mucosa clear , drainage- none,  tonsils- atrophic Neck- flexible , trachea midline, no stridor , thyroid nl, carotid no bruit Chest - symmetrical excursion , unlabored           Heart/CV- RRR , no murmur , no gallop  , no rub, nl s1 s2                           - JVD- none , edema- none, stasis changes- none, varices- none           Lung- clear to P&A, wheeze- none, cough- none , dullness-none, rub- none           Chest wall-  Abd-  Br/ Gen/ Rectal- Not done, not indicated Extrem- cyanosis- none, clubbing, none, atrophy- none, strength- nl Neuro- grossly intact to observation

## 2011-12-29 ENCOUNTER — Telehealth: Payer: Self-pay | Admitting: Internal Medicine

## 2011-12-29 NOTE — Telephone Encounter (Signed)
1 puff  Bid

## 2011-12-29 NOTE — Telephone Encounter (Signed)
lmomtcb---looks like the pts directions for the advair have been 1 puff daily.

## 2011-12-29 NOTE — Telephone Encounter (Signed)
Last OV on 12-23-11. I spoke with the pt and he states he has always been taking advair 1 puff twice a day, which is the standard dose.  According to pt med list and several previous phone notes the pt was taking Advair 1 puff daily. Pt insists he has always taken Advair twice daily. Please advise on what directions you want for the patient. Thanks. Carron Curie, CMA

## 2011-12-29 NOTE — Telephone Encounter (Signed)
[facility] and advised that the directions need to be changed on advair rx. I was advised that the original rx needs to be cancelled and a new one called in. So I called in new rx, but was then told that the original rx was too far in the process to be stopped, that they have attempted to put a stop on it but cannot guarantee that it will be stopped so it is possible that the pt will get 2 shipments of advair. I have spoken with Florentina Addison and she is aware and will call [facility] in the morning to see if anything has been stopped. Carron Curie, CMA

## 2011-12-30 NOTE — Telephone Encounter (Signed)
Spoke with Florentina Addison for clarification.  Per Florentina Addison, pt to continue taking the advair as he has been (it is documented as BID earlier in this note).  When he runs out, even if it's early, pt is to call to let Katie know.

## 2011-12-30 NOTE — Telephone Encounter (Signed)
Spoke with Amy at [facility] and she as the supervisor was able to cancel the corrected Rx called in on 12-29-11; pt to call us when he needs new Rx in 3 months-we can then send with 1 puff bid sig instead of 1 puff qd as CY sent and pt uses.   LMTCB-pt needs to know that he will get a shipment from [facility] for 1 puff qd and then needs to call us when new RX (in 3 months) to send correct sig although patient only uses 1 puff qd.

## 2012-01-01 NOTE — Assessment & Plan Note (Signed)
Satisfactory control now with Flonase. Needs refill.

## 2012-01-01 NOTE — Assessment & Plan Note (Signed)
Good control with Advair 250 and rescue inhaler, needing refills.

## 2012-01-04 NOTE — Telephone Encounter (Signed)
LMTCB-ask for Katie ONLY!!!! 

## 2012-01-16 NOTE — Telephone Encounter (Signed)
Katie, did you ever receive a call back from this pt?

## 2012-01-17 NOTE — Telephone Encounter (Signed)
ATC pt, NA and no option to leave a msg, WCB 

## 2012-01-17 NOTE — Telephone Encounter (Signed)
I have not gotten a call back-can we try calling again. I needed to let patient know that he will only get a half shipment from his mail order and will need to call me to let me know when he runs out(as he will run out early based on how RX was sent and how he uses medication). I will give him samples to last as this was our mistake. Also, patient needs to call me 2 weeks prior to running out of sample so I may send a new and correct RX for patient. Thanks.

## 2012-01-19 NOTE — Telephone Encounter (Signed)
ATC, NA and still no option to leave a msg

## 2012-01-20 NOTE — Telephone Encounter (Signed)
LMTCB

## 2012-01-23 NOTE — Telephone Encounter (Signed)
Pt is aware of the error in his Advair RX and says he will look at this closely and let us know how many doses he has in each of the 3 inhalers he received from mail order. Will forward back to Florentina Addison so she is aware.

## 2012-03-30 ENCOUNTER — Emergency Department (HOSPITAL_COMMUNITY)
Admission: EM | Admit: 2012-03-30 | Discharge: 2012-03-30 | Disposition: A | Discharge status: Home / Self Care | Payer: BC Managed Care – PPO | Attending: Emergency Medicine | Admitting: Emergency Medicine

## 2012-03-30 ENCOUNTER — Encounter (HOSPITAL_COMMUNITY): Payer: Self-pay | Admitting: Cardiology

## 2012-03-30 ENCOUNTER — Emergency Department (HOSPITAL_COMMUNITY): Payer: BC Managed Care – PPO

## 2012-03-30 DIAGNOSIS — J45909 Unspecified asthma, uncomplicated: Secondary | ICD-10-CM | POA: Insufficient documentation

## 2012-03-30 DIAGNOSIS — Z79899 Other long term (current) drug therapy: Secondary | ICD-10-CM | POA: Insufficient documentation

## 2012-03-30 DIAGNOSIS — R110 Nausea: Secondary | ICD-10-CM | POA: Insufficient documentation

## 2012-03-30 DIAGNOSIS — Z7982 Long term (current) use of aspirin: Secondary | ICD-10-CM | POA: Insufficient documentation

## 2012-03-30 DIAGNOSIS — N200 Calculus of kidney: Secondary | ICD-10-CM

## 2012-03-30 HISTORY — DX: Calculus of kidney: N20.0

## 2012-03-30 LAB — URINALYSIS, ROUTINE W REFLEX MICROSCOPIC
Ketones, ur: 15 mg/dL — AB
Leukocytes, UA: NEGATIVE
Protein, ur: 30 mg/dL — AB
Urobilinogen, UA: 0.2 mg/dL (ref 0.0–1.0)

## 2012-03-30 LAB — BASIC METABOLIC PANEL
Calcium: 9.5 mg/dL (ref 8.4–10.5)
Creatinine, Ser: 1.05 mg/dL (ref 0.50–1.35)
GFR calc Af Amer: >90 mL/min (ref >90)
GFR calc non Af Amer: 84 mL/min — ABNORMAL LOW (ref >90)
Glucose, Bld: 76 mg/dL (ref 70–99)

## 2012-03-30 LAB — URINE MICROSCOPIC-ADD ON

## 2012-03-30 MED ORDER — OXYCODONE-ACETAMINOPHEN 5-325 MG PO TABS
1.0000 | ORAL_TABLET | Freq: Once | ORAL | Status: AC
  Administered 2012-03-30: 1 via ORAL
  Filled 2012-03-30: qty 1

## 2012-03-30 MED ORDER — ONDANSETRON HCL 4 MG/2ML IJ SOLN
INTRAMUSCULAR | Status: AC
  Administered 2012-03-30: 4 mg
  Filled 2012-03-30: qty 2

## 2012-03-30 MED ORDER — SODIUM CHLORIDE 0.9 % IV BOLUS (SEPSIS)
1000.0000 mL | Freq: Once | INTRAVENOUS | Status: AC
  Administered 2012-03-30: 1000 mL via INTRAVENOUS

## 2012-03-30 MED ORDER — HYDROMORPHONE HCL PF 1 MG/ML IJ SOLN
INTRAMUSCULAR | Status: AC
  Filled 2012-03-30: qty 1

## 2012-03-30 MED ORDER — HYDROMORPHONE HCL PF 1 MG/ML IJ SOLN
1.0000 mg | Freq: Once | INTRAMUSCULAR | Status: AC
  Administered 2012-03-30: 1 mg via INTRAVENOUS
  Filled 2012-03-30: qty 1

## 2012-03-30 MED ORDER — OXYCODONE-ACETAMINOPHEN 5-325 MG PO TABS: 1.0000 | ORAL_TABLET | Freq: Four times a day (QID) | ORAL | Status: DC | PRN

## 2012-03-30 MED ORDER — FENTANYL CITRATE 0.05 MG/ML IJ SOLN
50.0000 ug | Freq: Once | INTRAMUSCULAR | Status: AC
  Administered 2012-03-30: 50 ug via INTRAVENOUS

## 2012-03-30 MED ORDER — FENTANYL CITRATE 0.05 MG/ML IJ SOLN
INTRAMUSCULAR | Status: AC
  Filled 2012-03-30: qty 2

## 2012-03-30 MED ORDER — HYDROMORPHONE HCL PF 1 MG/ML IJ SOLN
1.0000 mg | Freq: Once | INTRAMUSCULAR | Status: AC
  Administered 2012-03-30: 1 mg via INTRAVENOUS

## 2012-03-30 NOTE — ED Notes (Signed)
Pt has a urinal and knows we need a urine sample from him 

## 2012-03-30 NOTE — ED Notes (Signed)
Dr. Wofford at the bedside.  

## 2012-03-30 NOTE — ED Notes (Signed)
Pt reports left-sided flank pain that started about 20 minutes ago. Pt is diaphoretic and restless at triage. Hx of kidney stones.

## 2012-03-30 NOTE — ED Provider Notes (Signed)
History     CSN: [id number]  Arrival date & time 03/30/12  1403   First MD Initiated Contact with Patient 03/30/12 1441      Chief Complaint  Patient presents with  . Flank Pain    (Consider location/radiation/quality/duration/timing/severity/associated sxs/prior treatment) Patient is a 47 y.o. male presenting with flank pain. The history is provided by the patient and the spouse.  Flank Pain This is a new problem. The current episode started today (3 hours ago). The problem occurs constantly. The problem has been unchanged. Associated symptoms include nausea. Pertinent negatives include no abdominal pain, chest pain, coughing, fever, urinary symptoms or vomiting. Nothing aggravates the symptoms. Treatments tried: given fentanyl per pain protocol with moderate relief.    Past Medical History  Diagnosis Date  . Allergic rhinitis   . Asthma   . Kidney stones     Past Surgical History  Procedure Laterality Date  . Nose surgery      reset fx and cosmetic    Family History  Problem Relation Age of Onset  . Heart attack Mother     History  Substance Use Topics  . Smoking status: Never Smoker   . Smokeless tobacco: Not on file  . Alcohol Use: Not on file      Review of Systems  Constitutional: Negative for fever.  Respiratory: Negative for cough and shortness of breath.   Cardiovascular: Negative for chest pain.  Gastrointestinal: Positive for nausea. Negative for vomiting, abdominal pain and diarrhea.  Genitourinary: Positive for flank pain. Negative for hematuria and difficulty urinating.  All other systems reviewed and are negative.    Allergies  Review of patient's allergies indicates no known allergies.  Home Medications   Current Outpatient Rx  Name  Route  Sig  Dispense  Refill  . albuterol (PROAIR HFA) 108 (90 BASE) MCG/ACT inhaler   Inhalation   Inhale 2 puffs into the lungs every 4 (four) hours as needed for wheezing or shortness of breath.   3  Inhaler   3   . aspirin EC 81 MG tablet   Oral   Take 81 mg by mouth daily.         . fluticasone (FLONASE) 50 MCG/ACT nasal spray   Nasal   Place 1-2 sprays into the nose daily.         . Fluticasone-Salmeterol (ADVAIR DISKUS) 250-50 MCG/DOSE AEPB   Inhalation   Inhale 1 puff into the lungs daily. Rinse mouth   3 each   3     BP 149/91  Pulse 77  Temp(Src) 97.8 F (36.6 C) (Oral)  Resp 22  SpO2 95%  Physical Exam  Nursing note and vitals reviewed. Constitutional: He is oriented to person, place, and time. He appears well-developed and well-nourished. No distress.  HENT:  Head: Normocephalic and atraumatic.  Mouth/Throat: Oropharynx is clear and moist.  Eyes: Conjunctivae are normal. Pupils are equal, round, and reactive to light. No scleral icterus.  Neck: Normal range of motion. Neck supple.  Cardiovascular: Normal rate, regular rhythm, normal heart sounds and intact distal pulses.   No murmur heard. Pulmonary/Chest: Effort normal and breath sounds normal. No stridor. No respiratory distress. He has no wheezes. He has no rales.  Abdominal: Soft. He exhibits no distension. There is tenderness (left flank, LLQ) in the left lower quadrant. There is no rigidity, no guarding and no CVA tenderness. Hernia confirmed negative in the right inguinal area and confirmed negative in the left inguinal area.  Genitourinary:  Right testis shows no mass and no tenderness. Left testis shows no mass and no tenderness.  Musculoskeletal: Normal range of motion. He exhibits no edema.  Neurological: He is alert and oriented to person, place, and time.  Skin: Skin is warm and dry. No rash noted.  Psychiatric: He has a normal mood and affect. His behavior is normal.    ED Course  Procedures (including critical care time)  Labs Reviewed  BASIC METABOLIC PANEL - Abnormal; Notable for the following:    GFR calc non Af Amer 84 (*)    All other components within normal limits  URINALYSIS,  ROUTINE W REFLEX MICROSCOPIC - Abnormal; Notable for the following:    APPearance CLOUDY (*)    Hgb urine dipstick LARGE (*)    Ketones, ur 15 (*)    Protein, ur 30 (*)    All other components within normal limits  URINE MICROSCOPIC-ADD ON   US Renal  03/30/2012  *RADIOLOGY REPORT*  Clinical Data: History of left flank pain.  Previous history of urolithiasis.  RENAL/URINARY TRACT ULTRASOUND COMPLETE  Comparison:  CT 12/19/2002.  Findings:  Right Kidney:  Right renal length is 10.8 cm.  Left Kidney:  Left renal length is 11.5 cm.  Examination of each kidney shows no evidence of hydronephrosis, solid or cystic mass, calculus, parenchymal loss, or parenchymal textural abnormality.  Bladder:  Urinary bladder is incompletely distended.  No bladder abnormality is identified.  Prostate gland measured 3.8 x 3.7 x 3.2 cm.  IMPRESSION: No renal abnormalities are evident.   Original Report Authenticated By: Onalee Hua Call      1. Kidney stone       MDM  47 yo male with hx of kidney stones (last in 2004, seen on CT here) presenting with left flank pain, sudden onset, symptoms consistent with prior kidney stone.  Well appearing, stable vitals.  Pain better with IV fentanyl and Dilaudid.  Plan fluids, UA, and renal US.  No signs/symptoms of appendicitis, colitis, obstruction.  Do not think he needs CT imaging.   Pain under control.  UA negative for infection, pos for blood.  US shows no hydro.  Plan dc home with pain control and expectant management.           Rennis Petty, MD 03/30/12 [id number]

## 2012-03-30 NOTE — ED Provider Notes (Signed)
I saw and evaluated the patient, reviewed the resident's note and I agree with the findings and plan.  Patient seen and examined. He has renal colic but no signs of hydronephrosis on renal ultrasound. She'll be given pain medication and referral to urology  Toy Baker, MD 03/30/12 1906

## 2012-03-30 NOTE — ED Notes (Signed)
Wife at the bedside

## 2012-03-31 NOTE — ED Provider Notes (Signed)
I saw and evaluated the patient, reviewed the resident's note and I agree with the findings and plan.  Tacha Manni T Torell Minder, MD 03/31/12 1637 

## 2012-07-10 ENCOUNTER — Telehealth: Payer: Self-pay | Admitting: Internal Medicine

## 2012-07-10 MED ORDER — FLUTICASONE-SALMETEROL 250-50 MCG/DOSE IN AEPB: 1.0000 | INHALATION_SPRAY | Freq: Every day | RESPIRATORY_TRACT | Status: DC

## 2012-07-10 NOTE — Telephone Encounter (Signed)
Rx has been sent to [facility]. Pt is aware. Nothing further was needed. 

## 2012-07-17 ENCOUNTER — Telehealth: Payer: Self-pay | Admitting: Internal Medicine

## 2012-07-17 MED ORDER — FLUTICASONE-SALMETEROL 250-50 MCG/DOSE IN AEPB: 1.0000 | INHALATION_SPRAY | Freq: Every day | RESPIRATORY_TRACT | Status: DC

## 2012-07-17 NOTE — Telephone Encounter (Signed)
Called [facility]-spoke with Pam-she explained that they did get the RX electronically but however they tried to reach the patient about payment for RX. They need the patient to call them to take care of this before they can ship RX to him.   I explained to patient of the above and left 2 samples of Advair 250/50 at front for him to pick up. I have documented the samples in EPIC.  Nothing more needed as patient will call [facility] today.

## 2012-07-17 NOTE — Telephone Encounter (Signed)
Pt returned triage's call.  Holly D Pryor ° °

## 2012-07-17 NOTE — Telephone Encounter (Signed)
lmomtcb x1 for pt 

## 2012-11-22 ENCOUNTER — Other Ambulatory Visit: Payer: Self-pay

## 2012-12-14 ENCOUNTER — Other Ambulatory Visit: Payer: Self-pay | Admitting: Internal Medicine

## 2013-03-14 ENCOUNTER — Other Ambulatory Visit: Payer: Self-pay | Admitting: Internal Medicine

## 2014-06-13 ENCOUNTER — Other Ambulatory Visit: Payer: Self-pay | Admitting: Physician Assistant

## 2014-06-13 DIAGNOSIS — R1310 Dysphagia, unspecified: Secondary | ICD-10-CM

## 2014-06-17 ENCOUNTER — Ambulatory Visit
Admission: RE | Admit: 2014-06-17 | Discharge: 2014-06-17 | Disposition: A | Discharge status: Home / Self Care | Payer: BC Managed Care – PPO | Source: Ambulatory Visit | Attending: Physician Assistant | Admitting: Physician Assistant

## 2014-06-17 DIAGNOSIS — R1310 Dysphagia, unspecified: Secondary | ICD-10-CM

## 2014-08-22 ENCOUNTER — Other Ambulatory Visit: Payer: Self-pay | Admitting: Family Medicine

## 2014-08-22 ENCOUNTER — Ambulatory Visit
Admission: RE | Admit: 2014-08-22 | Discharge: 2014-08-22 | Disposition: A | Discharge status: Home / Self Care | Payer: BC Managed Care – PPO | Source: Ambulatory Visit | Attending: Family Medicine | Admitting: Family Medicine

## 2014-08-22 DIAGNOSIS — M436 Torticollis: Secondary | ICD-10-CM

## 2014-12-17 ENCOUNTER — Other Ambulatory Visit: Payer: Self-pay | Admitting: Orthopedic Surgery

## 2014-12-29 ENCOUNTER — Encounter (HOSPITAL_BASED_OUTPATIENT_CLINIC_OR_DEPARTMENT_OTHER): Payer: Self-pay | Admitting: *Deleted

## 2014-12-30 ENCOUNTER — Encounter (HOSPITAL_BASED_OUTPATIENT_CLINIC_OR_DEPARTMENT_OTHER)
Admission: RE | Admit: 2014-12-30 | Discharge: 2014-12-30 | Disposition: A | Discharge status: Home / Self Care | Payer: BC Managed Care – PPO | Source: Ambulatory Visit | Attending: Orthopedic Surgery | Admitting: Orthopedic Surgery

## 2014-12-30 DIAGNOSIS — Z886 Allergy status to analgesic agent status: Secondary | ICD-10-CM | POA: Diagnosis not present

## 2014-12-30 DIAGNOSIS — J45909 Unspecified asthma, uncomplicated: Secondary | ICD-10-CM | POA: Diagnosis not present

## 2014-12-30 DIAGNOSIS — K219 Gastro-esophageal reflux disease without esophagitis: Secondary | ICD-10-CM | POA: Diagnosis not present

## 2014-12-30 DIAGNOSIS — I10 Essential (primary) hypertension: Secondary | ICD-10-CM | POA: Diagnosis not present

## 2014-12-30 DIAGNOSIS — M9272 Juvenile osteochondrosis of metatarsus, left foot: Secondary | ICD-10-CM | POA: Diagnosis not present

## 2014-12-30 DIAGNOSIS — Z87442 Personal history of urinary calculi: Secondary | ICD-10-CM | POA: Diagnosis not present

## 2014-12-30 DIAGNOSIS — Z7982 Long term (current) use of aspirin: Secondary | ICD-10-CM | POA: Diagnosis not present

## 2014-12-30 DIAGNOSIS — E7800 Pure hypercholesterolemia, unspecified: Secondary | ICD-10-CM | POA: Diagnosis not present

## 2014-12-30 DIAGNOSIS — Z79899 Other long term (current) drug therapy: Secondary | ICD-10-CM | POA: Diagnosis not present

## 2015-01-01 ENCOUNTER — Ambulatory Visit (HOSPITAL_BASED_OUTPATIENT_CLINIC_OR_DEPARTMENT_OTHER): Payer: BC Managed Care – PPO | Admitting: Anesthesiology

## 2015-01-01 ENCOUNTER — Ambulatory Visit (HOSPITAL_BASED_OUTPATIENT_CLINIC_OR_DEPARTMENT_OTHER)
Admission: RE | Admit: 2015-01-01 | Discharge: 2015-01-01 | Disposition: A | Discharge status: Home / Self Care | Payer: BC Managed Care – PPO | Source: Ambulatory Visit | Attending: Orthopedic Surgery | Admitting: Orthopedic Surgery

## 2015-01-01 ENCOUNTER — Encounter (HOSPITAL_BASED_OUTPATIENT_CLINIC_OR_DEPARTMENT_OTHER)
Admission: RE | Disposition: A | Discharge status: Home / Self Care | Payer: Self-pay | Source: Ambulatory Visit | Attending: Orthopedic Surgery

## 2015-01-01 ENCOUNTER — Encounter (HOSPITAL_BASED_OUTPATIENT_CLINIC_OR_DEPARTMENT_OTHER): Payer: Self-pay | Admitting: *Deleted

## 2015-01-01 DIAGNOSIS — Z87442 Personal history of urinary calculi: Secondary | ICD-10-CM | POA: Insufficient documentation

## 2015-01-01 DIAGNOSIS — E7800 Pure hypercholesterolemia, unspecified: Secondary | ICD-10-CM | POA: Insufficient documentation

## 2015-01-01 DIAGNOSIS — Z7982 Long term (current) use of aspirin: Secondary | ICD-10-CM | POA: Insufficient documentation

## 2015-01-01 DIAGNOSIS — I10 Essential (primary) hypertension: Secondary | ICD-10-CM | POA: Insufficient documentation

## 2015-01-01 DIAGNOSIS — Z79899 Other long term (current) drug therapy: Secondary | ICD-10-CM | POA: Insufficient documentation

## 2015-01-01 DIAGNOSIS — M9272 Juvenile osteochondrosis of metatarsus, left foot: Secondary | ICD-10-CM | POA: Diagnosis not present

## 2015-01-01 DIAGNOSIS — J45909 Unspecified asthma, uncomplicated: Secondary | ICD-10-CM | POA: Insufficient documentation

## 2015-01-01 DIAGNOSIS — Z886 Allergy status to analgesic agent status: Secondary | ICD-10-CM | POA: Insufficient documentation

## 2015-01-01 DIAGNOSIS — K219 Gastro-esophageal reflux disease without esophagitis: Secondary | ICD-10-CM | POA: Insufficient documentation

## 2015-01-01 HISTORY — DX: Gastro-esophageal reflux disease without esophagitis: K21.9

## 2015-01-01 HISTORY — PX: METATARSAL OSTEOTOMY: SHX1641

## 2015-01-01 HISTORY — DX: Pure hypercholesterolemia, unspecified: E78.00

## 2015-01-01 HISTORY — DX: Essential (primary) hypertension: I10

## 2015-01-01 SURGERY — OSTEOTOMY, METATARSAL BONE
Anesthesia: Regional | Laterality: Left

## 2015-01-01 MED ORDER — CHLORHEXIDINE GLUCONATE 4 % EX LIQD: 60.0000 mL | Freq: Once | CUTANEOUS | Status: DC

## 2015-01-01 MED ORDER — MIDAZOLAM HCL 2 MG/2ML IJ SOLN
INTRAMUSCULAR | Status: AC
  Filled 2015-01-01: qty 2

## 2015-01-01 MED ORDER — BUPIVACAINE-EPINEPHRINE (PF) 0.5% -1:200000 IJ SOLN
INTRAMUSCULAR | Status: AC
  Filled 2015-01-01: qty 30

## 2015-01-01 MED ORDER — KETOROLAC TROMETHAMINE 30 MG/ML IJ SOLN: 30.0000 mg | Freq: Once | INTRAMUSCULAR | Status: DC

## 2015-01-01 MED ORDER — BUPIVACAINE-EPINEPHRINE (PF) 0.5% -1:200000 IJ SOLN
INTRAMUSCULAR | Status: DC | PRN
  Administered 2015-01-01: 30 mL via PERINEURAL

## 2015-01-01 MED ORDER — FENTANYL CITRATE (PF) 100 MCG/2ML IJ SOLN
50.0000 ug | INTRAMUSCULAR | Status: DC | PRN
  Administered 2015-01-01: 100 ug via INTRAVENOUS

## 2015-01-01 MED ORDER — OXYCODONE HCL 5 MG PO TABS: 5.0000 mg | ORAL_TABLET | Freq: Once | ORAL | Status: DC | PRN

## 2015-01-01 MED ORDER — LIDOCAINE HCL (CARDIAC) 20 MG/ML IV SOLN
INTRAVENOUS | Status: AC
Start: 2015-01-01 — End: 2015-01-01
  Filled 2015-01-01: qty 5

## 2015-01-01 MED ORDER — HYDROMORPHONE HCL 1 MG/ML IJ SOLN: 0.2500 mg | INTRAMUSCULAR | Status: DC | PRN

## 2015-01-01 MED ORDER — PROPOFOL 10 MG/ML IV BOLUS
INTRAVENOUS | Status: DC | PRN
  Administered 2015-01-01: 200 mg via INTRAVENOUS

## 2015-01-01 MED ORDER — DEXAMETHASONE SODIUM PHOSPHATE 10 MG/ML IJ SOLN
INTRAMUSCULAR | Status: DC | PRN
  Administered 2015-01-01: 10 mg via INTRAVENOUS

## 2015-01-01 MED ORDER — FENTANYL CITRATE (PF) 100 MCG/2ML IJ SOLN
INTRAMUSCULAR | Status: AC
  Filled 2015-01-01: qty 2

## 2015-01-01 MED ORDER — OXYCODONE HCL 5 MG PO TABS: 5.0000 mg | ORAL_TABLET | ORAL | Status: DC | PRN

## 2015-01-01 MED ORDER — DEXAMETHASONE SODIUM PHOSPHATE 10 MG/ML IJ SOLN
INTRAMUSCULAR | Status: AC
  Filled 2015-01-01: qty 1

## 2015-01-01 MED ORDER — LACTATED RINGERS IV SOLN
INTRAVENOUS | Status: DC
  Administered 2015-01-01 (×2): via INTRAVENOUS

## 2015-01-01 MED ORDER — OXYCODONE HCL 5 MG/5ML PO SOLN: 5.0000 mg | Freq: Once | ORAL | Status: DC | PRN

## 2015-01-01 MED ORDER — GLYCOPYRROLATE 0.2 MG/ML IJ SOLN: 0.2000 mg | Freq: Once | INTRAMUSCULAR | Status: DC | PRN

## 2015-01-01 MED ORDER — LIDOCAINE HCL (CARDIAC) 20 MG/ML IV SOLN
INTRAVENOUS | Status: DC | PRN
  Administered 2015-01-01: 30 mg via INTRAVENOUS

## 2015-01-01 MED ORDER — ONDANSETRON HCL 4 MG/2ML IJ SOLN
INTRAMUSCULAR | Status: DC | PRN
  Administered 2015-01-01: 4 mg via INTRAVENOUS

## 2015-01-01 MED ORDER — SODIUM CHLORIDE 0.9 % IV SOLN: INTRAVENOUS | Status: DC

## 2015-01-01 MED ORDER — CEFAZOLIN SODIUM-DEXTROSE 2-3 GM-% IV SOLR
2.0000 g | INTRAVENOUS | Status: AC
  Administered 2015-01-01: 2 g via INTRAVENOUS

## 2015-01-01 MED ORDER — PROMETHAZINE HCL 25 MG/ML IJ SOLN: 6.2500 mg | INTRAMUSCULAR | Status: DC | PRN

## 2015-01-01 MED ORDER — SCOPOLAMINE 1 MG/3DAYS TD PT72: 1.0000 | MEDICATED_PATCH | Freq: Once | TRANSDERMAL | Status: DC

## 2015-01-01 MED ORDER — SODIUM CHLORIDE 0.9 % IR SOLN
Status: DC | PRN
  Administered 2015-01-01: 1

## 2015-01-01 MED ORDER — ONDANSETRON HCL 4 MG/2ML IJ SOLN
INTRAMUSCULAR | Status: AC
  Filled 2015-01-01: qty 2

## 2015-01-01 MED ORDER — MIDAZOLAM HCL 2 MG/2ML IJ SOLN
1.0000 mg | INTRAMUSCULAR | Status: DC | PRN
  Administered 2015-01-01 (×3): 2 mg via INTRAVENOUS

## 2015-01-01 MED ORDER — CEFAZOLIN SODIUM-DEXTROSE 2-3 GM-% IV SOLR
INTRAVENOUS | Status: AC
Start: 2015-01-01 — End: 2015-01-01
  Filled 2015-01-01: qty 50

## 2015-01-01 SURGICAL SUPPLY — 60 items
BANDAGE ESMARK 6X9 LF (GAUZE/BANDAGES/DRESSINGS) ×1 IMPLANT
BLADE AVERAGE 25X9 (BLADE) ×2 IMPLANT
BLADE OSC/SAG .038X5.5 CUT EDG (BLADE) IMPLANT
BLADE SURG 15 STRL LF DISP TIS (BLADE) ×2 IMPLANT
BLADE SURG 15 STRL SS (BLADE) ×4
BNDG CMPR 9X6 STRL LF SNTH (GAUZE/BANDAGES/DRESSINGS) ×1
BNDG COHESIVE 4X5 TAN STRL (GAUZE/BANDAGES/DRESSINGS) ×2 IMPLANT
BNDG COHESIVE 6X5 TAN STRL LF (GAUZE/BANDAGES/DRESSINGS) IMPLANT
BNDG CONFORM 3 STRL LF (GAUZE/BANDAGES/DRESSINGS) ×2 IMPLANT
BNDG ESMARK 6X9 LF (GAUZE/BANDAGES/DRESSINGS) ×2
CHLORAPREP W/TINT 26ML (MISCELLANEOUS) ×2 IMPLANT
COVER BACK TABLE 60X90IN (DRAPES) ×2 IMPLANT
CUFF TOURNIQUET SINGLE 24IN (TOURNIQUET CUFF) IMPLANT
CUFF TOURNIQUET SINGLE 34IN LL (TOURNIQUET CUFF) IMPLANT
DRAPE EXTREMITY T 121X128X90 (DRAPE) ×2 IMPLANT
DRAPE OEC MINIVIEW 54X84 (DRAPES) ×2 IMPLANT
DRAPE U-SHAPE 47X51 STRL (DRAPES) ×2 IMPLANT
DRSG MEPITEL 4X7.2 (GAUZE/BANDAGES/DRESSINGS) ×2 IMPLANT
DRSG PAD ABDOMINAL 8X10 ST (GAUZE/BANDAGES/DRESSINGS) ×2 IMPLANT
ELECT REM PT RETURN 9FT ADLT (ELECTROSURGICAL) ×2
ELECTRODE REM PT RTRN 9FT ADLT (ELECTROSURGICAL) ×1 IMPLANT
GAUZE SPONGE 4X4 12PLY STRL (GAUZE/BANDAGES/DRESSINGS) ×2 IMPLANT
GLOVE BIO SURGEON STRL SZ8 (GLOVE) ×2 IMPLANT
GLOVE BIOGEL PI IND STRL 8 (GLOVE) ×2 IMPLANT
GLOVE BIOGEL PI INDICATOR 8 (GLOVE) ×2
GLOVE ECLIPSE 7.5 STRL STRAW (GLOVE) ×2 IMPLANT
GLOVE EXAM NITRILE MD LF STRL (GLOVE) IMPLANT
GOWN STRL REUS W/ TWL LRG LVL3 (GOWN DISPOSABLE) ×1 IMPLANT
GOWN STRL REUS W/ TWL XL LVL3 (GOWN DISPOSABLE) ×2 IMPLANT
GOWN STRL REUS W/TWL LRG LVL3 (GOWN DISPOSABLE) ×2
GOWN STRL REUS W/TWL XL LVL3 (GOWN DISPOSABLE) ×4
GUIDEWIRE ORTHO 0.054X7 SS (WIRE) IMPLANT
K-WIRE DBL END .054 LG (WIRE) IMPLANT
NEEDLE HYPO 22GX1.5 SAFETY (NEEDLE) IMPLANT
NS IRRIG 1000ML POUR BTL (IV SOLUTION) ×2 IMPLANT
PACK BASIN DAY SURGERY FS (CUSTOM PROCEDURE TRAY) ×2 IMPLANT
PAD CAST 4YDX4 CTTN HI CHSV (CAST SUPPLIES) ×1 IMPLANT
PADDING CAST ABS 4INX4YD NS (CAST SUPPLIES)
PADDING CAST ABS COTTON 4X4 ST (CAST SUPPLIES) IMPLANT
PADDING CAST COTTON 4X4 STRL (CAST SUPPLIES) ×2
PENCIL BUTTON HOLSTER BLD 10FT (ELECTRODE) ×2 IMPLANT
SANITIZER HAND PURELL 535ML FO (MISCELLANEOUS) ×2 IMPLANT
SCREW HCS TWIST-OFF 2.0X14MM (Screw) ×2 IMPLANT
SHEET MEDIUM DRAPE 40X70 STRL (DRAPES) ×2 IMPLANT
SLEEVE SCD COMPRESS KNEE MED (MISCELLANEOUS) ×2 IMPLANT
SPONGE LAP 18X18 X RAY DECT (DISPOSABLE) ×2 IMPLANT
STOCKINETTE 6  STRL (DRAPES) ×1
STOCKINETTE 6 STRL (DRAPES) ×1 IMPLANT
SUCTION FRAZIER TIP 10 FR DISP (SUCTIONS) IMPLANT
SUT ETHILON 3 0 PS 1 (SUTURE) ×2 IMPLANT
SUT MNCRL AB 3-0 PS2 18 (SUTURE) ×2 IMPLANT
SUT VIC AB 0 SH 27 (SUTURE) IMPLANT
SUT VIC AB 2-0 SH 27 (SUTURE)
SUT VIC AB 2-0 SH 27XBRD (SUTURE) IMPLANT
SYR BULB 3OZ (MISCELLANEOUS) ×2 IMPLANT
SYR CONTROL 10ML LL (SYRINGE) IMPLANT
TOWEL OR 17X24 6PK STRL BLUE (TOWEL DISPOSABLE) ×2 IMPLANT
TUBE CONNECTING 20X1/4 (TUBING) IMPLANT
UNDERPAD 30X30 (UNDERPADS AND DIAPERS) ×2 IMPLANT
YANKAUER SUCT BULB TIP NO VENT (SUCTIONS) IMPLANT

## 2015-01-01 NOTE — Brief Op Note (Signed)
01/01/2015  3:02 PM  PATIENT:  Kurt Walker  49 y.o. male  PRE-OPERATIVE DIAGNOSIS: 1.  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  Left 3rd MTP joint arthrotomy and removal of loose body   2.  Left 3rd MT dorsal exostectomy   3.  Left 3rd MT rotational osteotomy  SURGEON:  Toni ArthursJohn Angelette Ganus, MD  ASSISTANT: n/a  ANESTHESIA:   General, regional  EBL:  minimal   TOURNIQUET:   Total Tourniquet Time Documented: Thigh (Left) - 22 minutes Total: Thigh (Left) - 22 minutes  COMPLICATIONS:  None apparent  DISPOSITION:  Extubated, awake and stable to recovery.  DICTATION ID:  [id number]

## 2015-01-01 NOTE — Anesthesia Procedure Notes (Addendum)
Procedure Name: LMA Insertion Date/Time: 01/01/2015 2:00 PM Performed by: BLOCKER, TIMOTHY D Pre-anesthesia Checklist: Patient identified, Emergency Drugs available, Suction available and Patient being monitored Patient Re-evaluated:Patient Re-evaluated prior to inductionOxygen Delivery Method: Circle System Utilized Preoxygenation: Pre-oxygenation with 100% oxygen Intubation Type: IV induction Ventilation: Mask ventilation without difficulty LMA: LMA inserted LMA Size: 4.0 Number of attempts: 1 Airway Equipment and Method: Bite block Placement Confirmation: positive ETCO2 Tube secured with: Tape Dental Injury: Teeth and Oropharynx as per pre-operative assessment    Anesthesia Regional Block:  Popliteal block  Pre-Anesthetic Checklist: ,, timeout performed, Correct Patient, Correct Site, Correct Laterality, Correct Procedure, Correct Position, site marked, Risks and benefits discussed,  Surgical consent,  Pre-op evaluation,  At surgeon's request and post-op pain management  Laterality: Left  Prep: chloraprep       Needles:  Injection technique: Single-shot  Needle Type: Echogenic Stimulator Needle     Needle Length: 9cm 9 cm Needle Gauge: 21 and 21 G    Additional Needles:  Procedures: ultrasound guided (picture in chart) and nerve stimulator Popliteal block  Nerve Stimulator or Paresthesia:  Response: tibial, 0.5 mA,  Response: peroneal, 0.5 mA,   Additional Responses:   Narrative:  Injection made incrementally with aspirations every 5 mL.  Performed by: Personally  Anesthesiologist: Marcene DuosFITZGERALD, Christinea Brizuela

## 2015-01-01 NOTE — Progress Notes (Signed)
Assisted Dr. Rob Fitzgerald with left, ultrasound guided, popliteal block. Side rails up, monitors on throughout procedure. See vital signs in flow sheet. Tolerated Procedure well. 

## 2015-01-01 NOTE — H&P (Signed)
Kurt Walker is an 49 y.o. male.   Chief Complaint:  Left foot pain HPI: 49 y/o male with left forefoot pain a tthe 3rd MTP joint.  Xrays show Freiberg's infraction of the 3rd MT head.  He presents now for operative treatment.  Past Medical History  Diagnosis Date  . Allergic rhinitis   . Asthma   . Kidney stones   . Hypertension   . GERD (gastroesophageal reflux disease)   . High cholesterol     Past Surgical History  Procedure Laterality Date  . Nose surgery      reset fx and cosmetic    Family History  Problem Relation Age of Onset  . Heart attack Mother    Social History:  reports that he has never smoked. He does not have any smokeless tobacco history on file. He reports that he drinks alcohol. He reports that he does not use illicit drugs.  Allergies:  Allergies  Allergen Reactions  . Ibuprofen     Medications Prior to Admission  Medication Sig Dispense Refill  . acetaminophen (TYLENOL) 500 MG tablet Take 1,000 mg by mouth every 6 (six) hours as needed.    Marland Kitchen. ADVAIR DISKUS 250-50 MCG/DOSE AEPB USE ONE INHALATION DAILY (DISCARD 30 DAYS AFTER OPENING) RINSE MOUTH 3 each 0  . aspirin EC 81 MG tablet Take 81 mg by mouth daily.    . fluticasone (FLONASE) 50 MCG/ACT nasal spray Place 1-2 sprays into the nose daily.    Marland Kitchen. omeprazole (PRILOSEC) 20 MG capsule Take 20 mg by mouth daily.    . ramipril (ALTACE) 5 MG capsule Take 5 mg by mouth daily.    . rosuvastatin (CRESTOR) 20 MG tablet Take 20 mg by mouth daily.    Marland Kitchen. albuterol (PROAIR HFA) 108 (90 BASE) MCG/ACT inhaler Inhale 2 puffs into the lungs every 4 (four) hours as needed for wheezing or shortness of breath. 3 Inhaler 3    No results found for this or any previous visit (from the past 48 hour(s)). No results found.  ROS  No recent f/c/n/v/wt loss  Blood pressure 143/85, pulse 91, temperature 97.6 F (36.4 C), temperature source Oral, resp. rate 16, height 6' (1.829 m), weight 84.596 kg (186 lb 8 oz), SpO2 100  %. Physical Exam  wn wd male in nad.  A and O x 4.  Mood and affect normal.  EOMI.  resp unlabored.  Lfoot with swelling at the 3rd MTP joint.  TTP dorsally.  No lymphadenopathy.  5/5 strength in PF and DF of the toes.    Assessment/Plan L 3rd MTP joint freiberg's infraction - to OR for rotational osteotomy of the 3rd MT head v. MT head excision.  The risks and benefits of the alternative treatment options have been discussed in detail.  The patient wishes to proceed with surgery and specifically understands risks of bleeding, infection, nerve damage, blood clots, need for additional surgery, amputation and death.   Toni ArthursHEWITT, Keneisha Heckart 01/01/2015, 1:33 PM

## 2015-01-01 NOTE — Anesthesia Postprocedure Evaluation (Signed)
Anesthesia Post Note  Patient: Kurt Walker  Procedure(s) Performed: Procedure(s) (LRB): LEFT THIRD METATARSAL HEAD OSTEOTOMY  (Left)  Patient location during evaluation: PACU Anesthesia Type: General and Regional Level of consciousness: awake and alert Pain management: pain level controlled Vital Signs Assessment: post-procedure vital signs reviewed and stable Respiratory status: spontaneous breathing Cardiovascular status: blood pressure returned to baseline Anesthetic complications: no    Last Vitals:  Filed Vitals:   01/01/15 1500 01/01/15 1515  BP: 133/84 125/80  Pulse: 81 72  Temp:    Resp: 14 15    Last Pain:  Filed Vitals:   01/01/15 1531  PainSc: 0-No pain    LLE Motor Response: No movement due to regional block (01/01/15 1530) LLE Sensation: No sensation (absent) (01/01/15 1530)          Kennieth RadFitzgerald, Marisabel Macpherson E

## 2015-01-01 NOTE — Discharge Instructions (Signed)
Toni ArthursJohn Hewitt, MD [facility]  Please read the following information regarding your care after surgery.  Medications  You only need a prescription for the narcotic pain medicine (ex. oxycodone, Percocet, Norco).  All of the other medicines listed below are available over the counter. X acetominophen (Tylenol) 650 mg every 4-6 hours as you need for minor pain X oxycodone as prescribed for moderate to severe pain ?   Narcotic pain medicine (ex. oxycodone, Percocet, Vicodin) will cause constipation.  To prevent this problem, take the following medicines while you are taking any pain medicine. X docusate sodium (Colace) 100 mg twice a day X senna (Senokot) 2 tablets twice a day  Weight Bearing X Bear weight when you are able on your operated leg or foot in the post op shoe. ? Bear weight only on the heel of your operated foot in the post-op shoe. ? Do not bear any weight on the operated leg or foot.  Cast / Splint / Dressing X Keep your splint or cast clean and dry.  Dont put anything (coat hanger, pencil, etc) down inside of it.  If it gets damp, use a hair dryer on the cool setting to dry it.  If it gets soaked, call the office to schedule an appointment for a cast change. ? Remove your dressing 3 days after surgery and cover the incisions with dry dressings.    After your dressing, cast or splint is removed; you may shower, but do not soak or scrub the wound.  Allow the water to run over it, and then gently pat it dry.  Swelling It is normal for you to have swelling where you had surgery.  To reduce swelling and pain, keep your toes above your nose for at least 3 days after surgery.  It may be necessary to keep your foot or leg elevated for several weeks.  If it hurts, it should be elevated.  Follow Up Call my office at [phone] when you are discharged from the hospital or surgery center to schedule an appointment to be seen two weeks after surgery.  Call my office at  [phone] if you develop a fever >101.5 F, nausea, vomiting, bleeding from the surgical site or severe pain.     Post Anesthesia Home Care Instructions  Activity: Get plenty of rest for the remainder of the day. A responsible adult should stay with you for 24 hours following the procedure.  For the next 24 hours, DO NOT: -Drive a car -Advertising copywriterperate machinery -Drink alcoholic beverages -Take any medication unless instructed by your physician -Make any legal decisions or sign important papers.  Meals: Start with liquid foods such as gelatin or soup. Progress to regular foods as tolerated. Avoid greasy, spicy, heavy foods. If nausea and/or vomiting occur, drink only clear liquids until the nausea and/or vomiting subsides. Call your physician if vomiting continues.  Special Instructions/Symptoms: Your throat may feel dry or sore from the anesthesia or the breathing tube placed in your throat during surgery. If this causes discomfort, gargle with warm salt water. The discomfort should disappear within 24 hours.  If you had a scopolamine patch placed behind your ear for the management of post- operative nausea and/or vomiting:  1. The medication in the patch is effective for 72 hours, after which it should be removed.  Wrap patch in a tissue and discard in the trash. Wash hands thoroughly with soap and water. 2. You may remove the patch earlier than 72 hours if you experience unpleasant  side effects which may include dry mouth, dizziness or visual disturbances. 3. Avoid touching the patch. Wash your hands with soap and water after contact with the patch.     Regional Anesthesia Blocks  1. Numbness or the inability to move the "blocked" extremity may last from 3-48 hours after placement. The length of time depends on the medication injected and your individual response to the medication. If the numbness is not going away after 48 hours, call your surgeon.  2. The extremity that is blocked will  need to be protected until the numbness is gone and the  Strength has returned. Because you cannot feel it, you will need to take extra care to avoid injury. Because it may be weak, you may have difficulty moving it or using it. You may not know what position it is in without looking at it while the block is in effect.  3. For blocks in the legs and feet, returning to weight bearing and walking needs to be done carefully. You will need to wait until the numbness is entirely gone and the strength has returned. You should be able to move your leg and foot normally before you try and bear weight or walk. You will need someone to be with you when you first try to ensure you do not fall and possibly risk injury.  4. Bruising and tenderness at the needle site are common side effects and will resolve in a few days.  5. Persistent numbness or new problems with movement should be communicated to the surgeon or the [facility] [phone] [facility] [phone]).

## 2015-01-01 NOTE — Transfer of Care (Signed)
Immediate Anesthesia Transfer of Care Note  Patient: Kurt Walker  Procedure(s) Performed: Procedure(s): LEFT THIRD METATARSAL HEAD OSTEOTOMY  (Left)  Patient Location: PACU  Anesthesia Type:GA combined with regional for post-op pain  Level of Consciousness: awake, alert , oriented and patient cooperative  Airway & Oxygen Therapy: Patient Spontanous Breathing and Patient connected to face mask oxygen  Post-op Assessment: Report given to RN and Post -op Vital signs reviewed and stable  Post vital signs: Reviewed  Last Vitals:  Filed Vitals:   01/01/15 1230 01/01/15 1235  BP: 143/85   Pulse: 70 91  Temp:    Resp: 13 16    Complications: No apparent anesthesia complications

## 2015-01-01 NOTE — Anesthesia Preprocedure Evaluation (Addendum)
Anesthesia Evaluation  Patient identified by MRN, date of birth, ID band Patient awake    Reviewed: Allergy & Precautions, NPO status , Patient's Chart, lab work & pertinent test results  Airway Mallampati: I  TM Distance: >3 FB Neck ROM: Full    Dental  (+) Dental Advisory Given   Pulmonary asthma ,    breath sounds clear to auscultation       Cardiovascular hypertension, Pt. on medications  Rhythm:Regular Rate:Normal     Neuro/Psych negative neurological ROS     GI/Hepatic Neg liver ROS, GERD  ,  Endo/Other  negative endocrine ROS  Renal/GU negative Renal ROS     Musculoskeletal   Abdominal   Peds  Hematology negative hematology ROS (+)   Anesthesia Other Findings   Reproductive/Obstetrics                            Anesthesia Physical Anesthesia Plan  ASA: II  Anesthesia Plan: General and Regional   Post-op Pain Management:    Induction: Intravenous  Airway Management Planned: LMA  Additional Equipment:   Intra-op Plan:   Post-operative Plan: Extubation in OR  Informed Consent: I have reviewed the patients History and Physical, chart, labs and discussed the procedure including the risks, benefits and alternatives for the proposed anesthesia with the patient or authorized representative who has indicated his/her understanding and acceptance.   Dental advisory given  Plan Discussed with: CRNA  Anesthesia Plan Comments:         Anesthesia Quick Evaluation

## 2015-01-02 ENCOUNTER — Encounter (HOSPITAL_BASED_OUTPATIENT_CLINIC_OR_DEPARTMENT_OTHER): Payer: Self-pay | Admitting: Orthopedic Surgery

## 2015-01-02 NOTE — Op Note (Signed)
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  POSTOPERATIVE DIAGNOSES: 1. Left third metatarsal head avascular necrosis (Freiberg     infraction). 2. Left third MTP joint loose body and third metatarsal dorsal     exostoses.  PROCEDURE: 1. Left third MTP joint arthrotomy and removal of loose bodies. 2. Left third metatarsal head dorsal exostectomy. 3. Left third metatarsal rotational osteotomy.  SURGEON:  Toni Arthurs, M.D.  ANESTHESIA:  General, regional.  ESTIMATED BLOOD LOSS:  Minimal.  TOURNIQUET TIME:  22 minutes at 250 mmHg.  COMPLICATIONS:  None apparent.  DISPOSITION:  Extubated, awake, and stable to recovery.  INDICATIONS FOR PROCEDURE:  The patient is a 49 year old male, who complains of left forefoot pain for many months.  He has failed nonoperative treatment today including activity modification, oral anti- inflammatories, and shoe wear modification.  Radiographs reveal collapse of the metatarsal head with large dorsal osteophytes.  He presents now for operative treatment of this painful condition.  He understands the risks and benefits, the alternative treatment options, and elects surgical treatment.  He specifically understands risks of bleeding, infection, nerve damage, blood clots, need for additional surgery, continued pain, recurrence of his deformity, amputation, and death.  PROCEDURE IN DETAIL:  After preoperative consent was obtained and the correct operative site was identified, the patient was brought to the operating room and placed supine on the operating table.  General anesthesia was induced.   Preoperative antibiotics were administered. Surgical time-out was taken.  Left lower extremity was prepped and draped in standard sterile fashion with tourniquet around the thigh. The  extremity was exsanguinated, and the tourniquet was inflated to 250 mmHg.  A longitudinal incision was made over the third MTP joint.  Sharp dissection was carried down through skin and subcutaneous tissue.  The metatarsal head was exposed.  Immediately evident were multiple loose bodies within the dorsal aspect of the joint.  These were all removed in their entirety.  There was some calcification of the dorsal joint capsule that was also excised sharply.  There was an extremely large osteophyte over the dorsum of the metatarsal head.  This was removed with a rongeur.  The dorsal 25% of the joint was missing its articular cartilage.  The remaining articular cartilage appeared healthy, and there was no evidence of any collapse of the head.  The decision was made at that point to proceed with rotational osteotomy.  An oscillating saw was then used to create a closing wedge osteotomy at the dorsum of the joint removing the area of eburnated bone.  The head was rotated up on  the periosteal hinge on the plantar surface of the metatarsal.  The osteotomy was then fixed with a Biomet twist off screw.  AP and  lateral radiographs confirmed appropriate position and length of the hardware and appropriate correction of the metatarsal alignment.  Wound was irrigated copiously.  Skin was closed with Monocryl and nylon.  Sterile dressings were applied followed by a compression wrap.  Tourniquet was released at 22 minutes after application of the dressings.  The patient was awakened from anesthesia and transported to the recovery room in stable condition.  FOLLOWUP PLAN:  The patient will be weightbearing as tolerated in a flat postop shoe.  He will follow up with me in 2 weeks for suture removal.  RADIOGRAPHS:  AP and  lateral radiographs of the left foot were obtained intraoperatively.  These show interval correction of the third metatarsal head malalignment as well as removal of the dorsal osteophytes and loose bodies.     Toni ArthursJohn Adali Pennings, MD     JH/MEDQ  D:  01/01/2015  T:  01/02/2015  Job:  [id number]

## 2015-01-02 NOTE — Addendum Note (Signed)
Addendum  created 01/02/15 0753 by Lance CoonWesley Seniyah Esker, CRNA   Modules edited: Charges VN

## 2015-01-13 ENCOUNTER — Other Ambulatory Visit: Payer: Self-pay | Admitting: Family Medicine

## 2015-01-13 DIAGNOSIS — R109 Unspecified abdominal pain: Secondary | ICD-10-CM

## 2015-01-15 ENCOUNTER — Ambulatory Visit
Admission: RE | Admit: 2015-01-15 | Discharge: 2015-01-15 | Disposition: A | Discharge status: Home / Self Care | Payer: BC Managed Care – PPO | Source: Ambulatory Visit | Attending: Family Medicine | Admitting: Family Medicine

## 2015-01-15 DIAGNOSIS — R109 Unspecified abdominal pain: Secondary | ICD-10-CM

## 2015-01-21 ENCOUNTER — Other Ambulatory Visit: Payer: Self-pay | Admitting: Family Medicine

## 2015-01-21 DIAGNOSIS — R51 Headache: Principal | ICD-10-CM

## 2015-01-21 DIAGNOSIS — G4482 Headache associated with sexual activity: Secondary | ICD-10-CM

## 2015-01-21 DIAGNOSIS — R519 Headache, unspecified: Secondary | ICD-10-CM

## 2015-01-27 ENCOUNTER — Ambulatory Visit
Admission: RE | Admit: 2015-01-27 | Discharge: 2015-01-27 | Disposition: A | Discharge status: Home / Self Care | Payer: BC Managed Care – PPO | Source: Ambulatory Visit | Attending: Family Medicine | Admitting: Family Medicine

## 2015-01-27 DIAGNOSIS — R51 Headache: Principal | ICD-10-CM

## 2015-01-27 DIAGNOSIS — G4482 Headache associated with sexual activity: Secondary | ICD-10-CM

## 2015-01-27 DIAGNOSIS — R519 Headache, unspecified: Secondary | ICD-10-CM

## 2015-05-20 ENCOUNTER — Other Ambulatory Visit: Payer: Self-pay | Admitting: Orthopedic Surgery

## 2015-06-11 ENCOUNTER — Encounter (HOSPITAL_BASED_OUTPATIENT_CLINIC_OR_DEPARTMENT_OTHER): Payer: Self-pay | Admitting: *Deleted

## 2015-06-11 NOTE — Pre-Procedure Instructions (Signed)
Pt requests no nerve block for procedure on 06/18/15. Also noted by Dr. Victorino DikeHewitt.

## 2015-06-18 ENCOUNTER — Ambulatory Visit (HOSPITAL_BASED_OUTPATIENT_CLINIC_OR_DEPARTMENT_OTHER): Payer: BC Managed Care – PPO | Admitting: Certified Registered"

## 2015-06-18 ENCOUNTER — Ambulatory Visit (HOSPITAL_BASED_OUTPATIENT_CLINIC_OR_DEPARTMENT_OTHER)
Admission: RE | Admit: 2015-06-18 | Discharge: 2015-06-18 | Disposition: A | Discharge status: Home / Self Care | Payer: BC Managed Care – PPO | Source: Ambulatory Visit | Attending: Orthopedic Surgery | Admitting: Orthopedic Surgery

## 2015-06-18 ENCOUNTER — Encounter (HOSPITAL_BASED_OUTPATIENT_CLINIC_OR_DEPARTMENT_OTHER)
Admission: RE | Disposition: A | Discharge status: Home / Self Care | Payer: Self-pay | Source: Ambulatory Visit | Attending: Orthopedic Surgery

## 2015-06-18 ENCOUNTER — Encounter (HOSPITAL_BASED_OUTPATIENT_CLINIC_OR_DEPARTMENT_OTHER): Payer: Self-pay

## 2015-06-18 DIAGNOSIS — Z7982 Long term (current) use of aspirin: Secondary | ICD-10-CM | POA: Diagnosis not present

## 2015-06-18 DIAGNOSIS — I10 Essential (primary) hypertension: Secondary | ICD-10-CM | POA: Diagnosis not present

## 2015-06-18 DIAGNOSIS — M2042 Other hammer toe(s) (acquired), left foot: Secondary | ICD-10-CM | POA: Diagnosis not present

## 2015-06-18 DIAGNOSIS — Y838 Other surgical procedures as the cause of abnormal reaction of the patient, or of later complication, without mention of misadventure at the time of the procedure: Secondary | ICD-10-CM | POA: Diagnosis not present

## 2015-06-18 DIAGNOSIS — J45909 Unspecified asthma, uncomplicated: Secondary | ICD-10-CM | POA: Diagnosis not present

## 2015-06-18 DIAGNOSIS — M2041 Other hammer toe(s) (acquired), right foot: Secondary | ICD-10-CM

## 2015-06-18 DIAGNOSIS — K219 Gastro-esophageal reflux disease without esophagitis: Secondary | ICD-10-CM | POA: Insufficient documentation

## 2015-06-18 DIAGNOSIS — T8484XA Pain due to internal orthopedic prosthetic devices, implants and grafts, initial encounter: Secondary | ICD-10-CM | POA: Insufficient documentation

## 2015-06-18 HISTORY — PX: HARDWARE REMOVAL: SHX979

## 2015-06-18 HISTORY — PX: TENDON TRANSFER: SHX6109

## 2015-06-18 SURGERY — REMOVAL, HARDWARE
Anesthesia: General | Site: Foot | Laterality: Left

## 2015-06-18 MED ORDER — MIDAZOLAM HCL 2 MG/2ML IJ SOLN
INTRAMUSCULAR | Status: AC
  Filled 2015-06-18: qty 2

## 2015-06-18 MED ORDER — SCOPOLAMINE 1 MG/3DAYS TD PT72
1.0000 | MEDICATED_PATCH | Freq: Once | TRANSDERMAL | Status: DC | PRN
Start: 2015-06-18 — End: 2015-06-18

## 2015-06-18 MED ORDER — BUPIVACAINE-EPINEPHRINE 0.5% -1:200000 IJ SOLN
INTRAMUSCULAR | Status: DC | PRN
  Administered 2015-06-18: 17 mL

## 2015-06-18 MED ORDER — CEFAZOLIN SODIUM-DEXTROSE 2-4 GM/100ML-% IV SOLN
INTRAVENOUS | Status: AC
  Filled 2015-06-18: qty 100

## 2015-06-18 MED ORDER — MIDAZOLAM HCL 2 MG/2ML IJ SOLN
1.0000 mg | INTRAMUSCULAR | Status: DC | PRN
  Administered 2015-06-18: 2 mg via INTRAVENOUS

## 2015-06-18 MED ORDER — PROPOFOL 500 MG/50ML IV EMUL
INTRAVENOUS | Status: AC
  Filled 2015-06-18: qty 50

## 2015-06-18 MED ORDER — ONDANSETRON HCL 4 MG/2ML IJ SOLN
INTRAMUSCULAR | Status: AC
  Filled 2015-06-18: qty 2

## 2015-06-18 MED ORDER — LIDOCAINE 2% (20 MG/ML) 5 ML SYRINGE
INTRAMUSCULAR | Status: AC
  Filled 2015-06-18: qty 5

## 2015-06-18 MED ORDER — BUPIVACAINE-EPINEPHRINE (PF) 0.5% -1:200000 IJ SOLN
INTRAMUSCULAR | Status: AC
  Filled 2015-06-18: qty 30

## 2015-06-18 MED ORDER — FENTANYL CITRATE (PF) 100 MCG/2ML IJ SOLN
INTRAMUSCULAR | Status: AC
  Filled 2015-06-18: qty 2

## 2015-06-18 MED ORDER — HYDROMORPHONE HCL 1 MG/ML IJ SOLN
INTRAMUSCULAR | Status: AC
  Filled 2015-06-18: qty 1

## 2015-06-18 MED ORDER — DEXAMETHASONE SODIUM PHOSPHATE 10 MG/ML IJ SOLN
INTRAMUSCULAR | Status: AC
  Filled 2015-06-18: qty 1

## 2015-06-18 MED ORDER — HYDROMORPHONE HCL 1 MG/ML IJ SOLN
0.2500 mg | INTRAMUSCULAR | Status: DC | PRN
  Administered 2015-06-18: 0.5 mg via INTRAVENOUS

## 2015-06-18 MED ORDER — PROPOFOL 10 MG/ML IV BOLUS
INTRAVENOUS | Status: DC | PRN
  Administered 2015-06-18: 200 mg via INTRAVENOUS

## 2015-06-18 MED ORDER — CHLORHEXIDINE GLUCONATE 4 % EX LIQD: 60.0000 mL | Freq: Once | CUTANEOUS | Status: DC

## 2015-06-18 MED ORDER — GLYCOPYRROLATE 0.2 MG/ML IJ SOLN: 0.2000 mg | Freq: Once | INTRAMUSCULAR | Status: DC | PRN

## 2015-06-18 MED ORDER — FENTANYL CITRATE (PF) 100 MCG/2ML IJ SOLN
50.0000 ug | INTRAMUSCULAR | Status: AC | PRN
  Administered 2015-06-18: 100 ug via INTRAVENOUS
  Administered 2015-06-18 (×2): 25 ug via INTRAVENOUS

## 2015-06-18 MED ORDER — LIDOCAINE HCL (CARDIAC) 20 MG/ML IV SOLN
INTRAVENOUS | Status: DC | PRN
  Administered 2015-06-18: 60 mg via INTRAVENOUS

## 2015-06-18 MED ORDER — 0.9 % SODIUM CHLORIDE (POUR BTL) OPTIME
TOPICAL | Status: DC | PRN
  Administered 2015-06-18: 150 mL

## 2015-06-18 MED ORDER — HYDROCODONE-ACETAMINOPHEN 5-325 MG PO TABS: 1.0000 | ORAL_TABLET | Freq: Four times a day (QID) | ORAL | Status: DC | PRN

## 2015-06-18 MED ORDER — SODIUM CHLORIDE 0.9 % IV SOLN: INTRAVENOUS | Status: DC

## 2015-06-18 MED ORDER — LACTATED RINGERS IV SOLN
INTRAVENOUS | Status: DC
  Administered 2015-06-18 (×2): via INTRAVENOUS

## 2015-06-18 MED ORDER — DEXAMETHASONE SODIUM PHOSPHATE 4 MG/ML IJ SOLN
INTRAMUSCULAR | Status: DC | PRN
  Administered 2015-06-18: 10 mg via INTRAVENOUS

## 2015-06-18 MED ORDER — ONDANSETRON HCL 4 MG/2ML IJ SOLN
INTRAMUSCULAR | Status: DC | PRN
  Administered 2015-06-18: 4 mg via INTRAVENOUS

## 2015-06-18 MED ORDER — CEFAZOLIN SODIUM-DEXTROSE 2-4 GM/100ML-% IV SOLN
2.0000 g | INTRAVENOUS | Status: AC
  Administered 2015-06-18: 2 g via INTRAVENOUS

## 2015-06-18 SURGICAL SUPPLY — 73 items
APL SKNCLS STERI-STRIP NONHPOA (GAUZE/BANDAGES/DRESSINGS)
BANDAGE ACE 4X5 VEL STRL LF (GAUZE/BANDAGES/DRESSINGS) IMPLANT
BANDAGE ESMARK 6X9 LF (GAUZE/BANDAGES/DRESSINGS) ×1 IMPLANT
BENZOIN TINCTURE PRP APPL 2/3 (GAUZE/BANDAGES/DRESSINGS) IMPLANT
BLADE AVERAGE 25X9 (BLADE) IMPLANT
BLADE OSC/SAG .038X5.5 CUT EDG (BLADE) IMPLANT
BLADE SURG 15 STRL LF DISP TIS (BLADE) ×2 IMPLANT
BLADE SURG 15 STRL SS (BLADE) ×4
BNDG CMPR 9X6 STRL LF SNTH (GAUZE/BANDAGES/DRESSINGS) ×1
BNDG COHESIVE 4X5 TAN STRL (GAUZE/BANDAGES/DRESSINGS) ×2 IMPLANT
BNDG COHESIVE 6X5 TAN STRL LF (GAUZE/BANDAGES/DRESSINGS) ×1 IMPLANT
BNDG CONFORM 2 STRL LF (GAUZE/BANDAGES/DRESSINGS) IMPLANT
BNDG ESMARK 6X9 LF (GAUZE/BANDAGES/DRESSINGS) ×2
CHLORAPREP W/TINT 26ML (MISCELLANEOUS) ×2 IMPLANT
COVER BACK TABLE 60X90IN (DRAPES) ×2 IMPLANT
CUFF TOURNIQUET SINGLE 34IN LL (TOURNIQUET CUFF) ×1 IMPLANT
DECANTER SPIKE VIAL GLASS SM (MISCELLANEOUS) IMPLANT
DRAPE EXTREMITY T 121X128X90 (DRAPE) ×2 IMPLANT
DRAPE OEC MINIVIEW 54X84 (DRAPES) IMPLANT
DRAPE U-SHAPE 47X51 STRL (DRAPES) ×2 IMPLANT
DRSG MEPITEL 4X7.2 (GAUZE/BANDAGES/DRESSINGS) ×2 IMPLANT
DRSG PAD ABDOMINAL 8X10 ST (GAUZE/BANDAGES/DRESSINGS) ×3 IMPLANT
ELECT REM PT RETURN 9FT ADLT (ELECTROSURGICAL) ×2
ELECTRODE REM PT RTRN 9FT ADLT (ELECTROSURGICAL) ×1 IMPLANT
GAUZE SPONGE 4X4 12PLY STRL (GAUZE/BANDAGES/DRESSINGS) ×2 IMPLANT
GLOVE BIO SURGEON STRL SZ8 (GLOVE) ×2 IMPLANT
GLOVE BIOGEL PI IND STRL 7.0 (GLOVE) IMPLANT
GLOVE BIOGEL PI IND STRL 8 (GLOVE) ×2 IMPLANT
GLOVE BIOGEL PI INDICATOR 7.0 (GLOVE) ×1
GLOVE BIOGEL PI INDICATOR 8 (GLOVE) ×1
GLOVE ECLIPSE 6.5 STRL STRAW (GLOVE) ×1 IMPLANT
GLOVE ECLIPSE 7.5 STRL STRAW (GLOVE) ×1 IMPLANT
GLOVE EXAM NITRILE MD LF STRL (GLOVE) ×1 IMPLANT
GOWN STRL REUS W/ TWL LRG LVL3 (GOWN DISPOSABLE) ×1 IMPLANT
GOWN STRL REUS W/ TWL XL LVL3 (GOWN DISPOSABLE) ×2 IMPLANT
GOWN STRL REUS W/TWL LRG LVL3 (GOWN DISPOSABLE) ×2
GOWN STRL REUS W/TWL XL LVL3 (GOWN DISPOSABLE) ×2
NDL SUT 6 .5 CRC .975X.05 MAYO (NEEDLE) IMPLANT
NEEDLE HYPO 22GX1.5 SAFETY (NEEDLE) ×1 IMPLANT
NEEDLE MAYO TAPER (NEEDLE)
PACK BASIN DAY SURGERY FS (CUSTOM PROCEDURE TRAY) ×2 IMPLANT
PAD CAST 4YDX4 CTTN HI CHSV (CAST SUPPLIES) ×1 IMPLANT
PADDING CAST ABS 4INX4YD NS (CAST SUPPLIES)
PADDING CAST ABS COTTON 4X4 ST (CAST SUPPLIES) IMPLANT
PADDING CAST COTTON 4X4 STRL (CAST SUPPLIES) ×2
PADDING CAST COTTON 6X4 STRL (CAST SUPPLIES) ×1 IMPLANT
PASSER SUT SWANSON 36MM LOOP (INSTRUMENTS) IMPLANT
PENCIL BUTTON HOLSTER BLD 10FT (ELECTRODE) ×2 IMPLANT
RETRIEVER SUT HEWSON (MISCELLANEOUS) IMPLANT
SANITIZER HAND PURELL 535ML FO (MISCELLANEOUS) ×2 IMPLANT
SHEET MEDIUM DRAPE 40X70 STRL (DRAPES) ×2 IMPLANT
SLEEVE SCD COMPRESS KNEE MED (MISCELLANEOUS) ×2 IMPLANT
SPLINT FAST PLASTER 5X30 (CAST SUPPLIES)
SPLINT PLASTER CAST FAST 5X30 (CAST SUPPLIES) ×20 IMPLANT
SPONGE LAP 18X18 X RAY DECT (DISPOSABLE) ×2 IMPLANT
STAPLER VISISTAT 35W (STAPLE) IMPLANT
STOCKINETTE 6  STRL (DRAPES) ×1
STOCKINETTE 6 STRL (DRAPES) ×1 IMPLANT
STRIP CLOSURE SKIN 1/2X4 (GAUZE/BANDAGES/DRESSINGS) IMPLANT
SUCTION FRAZIER HANDLE 10FR (MISCELLANEOUS)
SUCTION TUBE FRAZIER 10FR DISP (MISCELLANEOUS) IMPLANT
SUT ETHILON 3 0 PS 1 (SUTURE) ×2 IMPLANT
SUT MERSILENE 2.0 SH NDLE (SUTURE) IMPLANT
SUT MNCRL AB 3-0 PS2 18 (SUTURE) ×1 IMPLANT
SUT VIC AB 0 SH 27 (SUTURE) IMPLANT
SUT VIC AB 2-0 SH 27 (SUTURE) ×2
SUT VIC AB 2-0 SH 27XBRD (SUTURE) IMPLANT
SUT VICRYL 0 UR6 27IN ABS (SUTURE) IMPLANT
SYR BULB 3OZ (MISCELLANEOUS) ×2 IMPLANT
SYR CONTROL 10ML LL (SYRINGE) ×1 IMPLANT
TOWEL OR 17X24 6PK STRL BLUE (TOWEL DISPOSABLE) ×4 IMPLANT
TUBE CONNECTING 20X1/4 (TUBING) IMPLANT
UNDERPAD 30X30 (UNDERPADS AND DIAPERS) ×2 IMPLANT

## 2015-06-18 NOTE — Anesthesia Postprocedure Evaluation (Signed)
Anesthesia Post Note  Patient: Kurt Walker  Procedure(s) Performed: Procedure(s) (LRB): LEFT THIRD METATARSAL REMOVAL OF DEEP IMPLANT (Left) LEFT FLEXOR TO EXTENSOR TRANSFER (Left)  Patient location during evaluation: PACU Anesthesia Type: General Level of consciousness: awake and alert Pain management: pain level controlled Vital Signs Assessment: post-procedure vital signs reviewed and stable Respiratory status: spontaneous breathing, nonlabored ventilation and respiratory function stable Cardiovascular status: blood pressure returned to baseline and stable Postop Assessment: no signs of nausea or vomiting Anesthetic complications: no    Last Vitals:  Filed Vitals:   06/18/15 0900 06/18/15 0914  BP: 123/88 122/83  Pulse: 67 60  Temp:    Resp: 12 12    Last Pain:  Filed Vitals:   06/18/15 0916  PainSc: 2     LLE Motor Response: Purposeful movement (06/18/15 0914) LLE Sensation: Decreased (06/18/15 0914)          Mayda Shippee,W. EDMOND

## 2015-06-18 NOTE — Brief Op Note (Signed)
06/18/2015  8:44 AM  PATIENT:  Kurt Walker  50 y.o. male  PRE-OPERATIVE DIAGNOSIS:  LEFT THIRD METATARSAL PAINFUL HARDWARE, POSSIBLE PLANTAR PLATE TEAR  POST-OPERATIVE DIAGNOSIS:  LEFT THIRD METATARSAL PAINFUL HARDWARE, left 3rd hammertoe  Procedure(s): 1.  LEFT THIRD METATARSAL REMOVAL OF DEEP IMPLANT 2.  Left 3rd toe hammertoe correction with FLEXOR TO EXTENSOR TRANSFER 3.  Open left 3rd toe flexor tendon release  SURGEON:  Toni ArthursJohn Cameryn Schum, MD  ASSISTANT: n/a  ANESTHESIA:   General  EBL:  minimal   TOURNIQUET:   Total Tourniquet Time Documented: Thigh (Left) - 28 minutes Total: Thigh (Left) - 28 minutes  COMPLICATIONS:  None apparent  DISPOSITION:  Extubated, awake and stable to recovery.  DICTATION ID:

## 2015-06-18 NOTE — Transfer of Care (Signed)
Immediate Anesthesia Transfer of Care Note  Patient: Kurt Walker  Procedure(s) Performed: Procedure(s): LEFT THIRD METATARSAL REMOVAL OF DEEP IMPLANT (Left) LEFT FLEXOR TO EXTENSOR TRANSFER (Left)  Patient Location: PACU  Anesthesia Type:General  Level of Consciousness: awake and patient cooperative  Airway & Oxygen Therapy: Patient Spontanous Breathing and Patient connected to face mask oxygen  Post-op Assessment: Report given to RN and Post -op Vital signs reviewed and stable  Post vital signs: Reviewed and stable  Last Vitals:  Filed Vitals:   06/18/15 0642  BP: 134/80  Pulse: 65  Temp: 36.6 C  Resp: 20    Last Pain: There were no vitals filed for this visit.       Complications: No apparent anesthesia complications

## 2015-06-18 NOTE — H&P (Signed)
Kurt Walker is an 50 y.o. male.   Chief Complaint: left forefoot pain HPI: 50 y/o male with persistent left forefoot pain after 3rd MT weil osteotomy 6 months ago.  He presents today for removal of the deep implant and possible revision hammertoe correction.    Past Medical History  Diagnosis Date  . Allergic rhinitis   . Asthma   . Kidney stones   . Hypertension   . GERD (gastroesophageal reflux disease)   . High cholesterol     Past Surgical History  Procedure Laterality Date  . Nose surgery      reset fx and cosmetic  . Metatarsal osteotomy Left 01/01/2015    Procedure: LEFT THIRD METATARSAL HEAD OSTEOTOMY ;  Surgeon: Toni ArthursJohn Johnnette Laux, MD;  Location: [facility];  Service: Orthopedics;  Laterality: Left;    Family History  Problem Relation Age of Onset  . Heart attack Mother    Social History:  reports that he has never smoked. He does not have any smokeless tobacco history on file. He reports that he drinks alcohol. He reports that he does not use illicit drugs.  Allergies:  Allergies  Allergen Reactions  . Ibuprofen     Medications Prior to Admission  Medication Sig Dispense Refill  . acetaminophen (TYLENOL) 500 MG tablet Take 1,000 mg by mouth every 6 (six) hours as needed.    Marland Kitchen. ADVAIR DISKUS 250-50 MCG/DOSE AEPB USE ONE INHALATION DAILY (DISCARD 30 DAYS AFTER OPENING) RINSE MOUTH 3 each 0  . aspirin EC 81 MG tablet Take 81 mg by mouth daily.    . fluticasone (FLONASE) 50 MCG/ACT nasal spray Place 1-2 sprays into the nose daily.    . ramipril (ALTACE) 5 MG capsule Take 5 mg by mouth daily.    . rosuvastatin (CRESTOR) 20 MG tablet Take 20 mg by mouth daily.      No results found for this or any previous visit (from the past 48 hour(s)). No results found.  ROS  No recent f/c/n/v/wt loss  Blood pressure 134/80, pulse 65, temperature 97.9 F (36.6 C), temperature source Oral, resp. rate 20, height 6' (1.829 m), weight 85.333 kg (188 lb 2 oz), SpO2 100  %. Physical Exam  wn wd male in nad.  A and Ox 4.  Mood and affect normal.  EOMI.  resp unlabored.  L forefoot with ttp at the plantar 3rd MT head.  Skin o/w healthy and intact.  No lymphadenopathy.  5/5 strength in PF and DF of the toes.  Sens to LT intact at the forefoot.  Assessment/Plan L 3rd MT painful hardware and hammertoe deformity - to OR for removal of deep implant and possible hammertoe correction.  The risks and benefits of the alternative treatment options have been discussed in detail.  The patient wishes to proceed with surgery and specifically understands risks of bleeding, infection, nerve damage, blood clots, need for additional surgery, amputation and death.   Toni ArthursHEWITT, Christpoher Sievers, MD 06/18/2015, 7:25 AM

## 2015-06-18 NOTE — Anesthesia Preprocedure Evaluation (Addendum)
Anesthesia Evaluation  Patient identified by MRN, date of birth, ID band Patient awake    Reviewed: Allergy & Precautions, H&P , NPO status , Patient's Chart, lab work & pertinent test results  Airway Mallampati: II  TM Distance: >3 FB Neck ROM: Full    Dental no notable dental hx. (+) Teeth Intact, Dental Advisory Given   Pulmonary asthma ,    Pulmonary exam normal breath sounds clear to auscultation       Cardiovascular hypertension, Pt. on medications  Rhythm:Regular Rate:Normal     Neuro/Psych negative neurological ROS  negative psych ROS   GI/Hepatic Neg liver ROS, GERD  Medicated,  Endo/Other  negative endocrine ROS  Renal/GU negative Renal ROS  negative genitourinary   Musculoskeletal   Abdominal   Peds  Hematology negative hematology ROS (+)   Anesthesia Other Findings   Reproductive/Obstetrics negative OB ROS                           Anesthesia Physical Anesthesia Plan  ASA: II  Anesthesia Plan: General   Post-op Pain Management:    Induction: Intravenous  Airway Management Planned: LMA  Additional Equipment:   Intra-op Plan:   Post-operative Plan: Extubation in OR  Informed Consent: I have reviewed the patients History and Physical, chart, labs and discussed the procedure including the risks, benefits and alternatives for the proposed anesthesia with the patient or authorized representative who has indicated his/her understanding and acceptance.   Dental advisory given  Plan Discussed with: CRNA  Anesthesia Plan Comments:         Anesthesia Quick Evaluation

## 2015-06-18 NOTE — Anesthesia Procedure Notes (Signed)
Procedure Name: LMA Insertion Date/Time: 06/18/2015 7:34 AM Performed by: Cam Harnden D Pre-anesthesia Checklist: Patient identified, Emergency Drugs available, Suction available and Patient being monitored Patient Re-evaluated:Patient Re-evaluated prior to inductionOxygen Delivery Method: Circle system utilized Preoxygenation: Pre-oxygenation with 100% oxygen Intubation Type: IV induction Ventilation: Mask ventilation without difficulty LMA: LMA inserted LMA Size: 4.0 Number of attempts: 1 Airway Equipment and Method: Bite block Placement Confirmation: positive ETCO2 Tube secured with: Tape Dental Injury: Teeth and Oropharynx as per pre-operative assessment

## 2015-06-18 NOTE — Discharge Instructions (Addendum)
Kurt ArthursJohn Hewitt, MD [facility]  Please read the following information regarding your care after surgery.  Medications  You only need a prescription for the narcotic pain medicine (ex. oxycodone, Percocet, Norco).  All of the other medicines listed below are available over the counter. X norco 5/325 as prescribed for severe pain X Aleve 220 mg twice a day for mild to moderate pain   Narcotic pain medicine (ex. oxycodone, Percocet, Vicodin) will cause constipation.  To prevent this problem, take the following medicines while you are taking any pain medicine. X docusate sodium (Colace) 100 mg twice a day X senna (Senokot) 2 tablets twice a day  Weight Bearing ? Bear weight when you are able on your operated leg or foot. X Bear weight on your operated foot in the post-op shoe. ? Do not bear any weight on the operated leg or foot.  Cast / Splint / Dressing X Keep your splint or cast clean and dry.  Dont put anything (coat hanger, pencil, etc) down inside of it.  If it gets damp, use a hair dryer on the cool setting to dry it.  If it gets soaked, call the office to schedule an appointment for a cast change. ? Remove your dressing 3 days after surgery and cover the incisions with dry dressings.    After your dressing, cast or splint is removed; you may shower, but do not soak or scrub the wound.  Allow the water to run over it, and then gently pat it dry.  Swelling It is normal for you to have swelling where you had surgery.  To reduce swelling and pain, keep your toes above your nose for at least 3 days after surgery.  It may be necessary to keep your foot or leg elevated for several weeks.  If it hurts, it should be elevated.  Follow Up Call my office at [phone] when you are discharged from the hospital or surgery center to schedule an appointment to be seen two weeks after surgery.  Call my office at [phone] if you develop a fever >101.5 F, nausea, vomiting, bleeding  from the surgical site or severe pain.        Post Anesthesia Home Care Instructions  Activity: Get plenty of rest for the remainder of the day. A responsible adult should stay with you for 24 hours following the procedure.  For the next 24 hours, DO NOT: -Drive a car -Advertising copywriterperate machinery -Drink alcoholic beverages -Take any medication unless instructed by your physician -Make any legal decisions or sign important papers.  Meals: Start with liquid foods such as gelatin or soup. Progress to regular foods as tolerated. Avoid greasy, spicy, heavy foods. If nausea and/or vomiting occur, drink only clear liquids until the nausea and/or vomiting subsides. Call your physician if vomiting continues.  Special Instructions/Symptoms: Your throat may feel dry or sore from the anesthesia or the breathing tube placed in your throat during surgery. If this causes discomfort, gargle with warm salt water. The discomfort should disappear within 24 hours.  If you had a scopolamine patch placed behind your ear for the management of post- operative nausea and/or vomiting:  1. The medication in the patch is effective for 72 hours, after which it should be removed.  Wrap patch in a tissue and discard in the trash. Wash hands thoroughly with soap and water. 2. You may remove the patch earlier than 72 hours if you experience unpleasant side effects which may include dry mouth, dizziness or visual  disturbances. 3. Avoid touching the patch. Wash your hands with soap and water after contact with the patch.

## 2015-06-19 ENCOUNTER — Encounter (HOSPITAL_BASED_OUTPATIENT_CLINIC_OR_DEPARTMENT_OTHER): Payer: Self-pay | Admitting: Orthopedic Surgery

## 2015-06-19 NOTE — Op Note (Signed)
Kurt Walker, GUT NO.:  [id number]  MEDICAL RECORD NO.:  [id number]  LOCATION:                                 FACILITY:  PHYSICIAN:  Kurt Arthurs, MD             DATE OF BIRTH:  DATE OF PROCEDURE:  06/18/2015 DATE OF DISCHARGE:                              OPERATIVE REPORT   POSTOPERATIVE DIAGNOSES: 1. Left third metatarsal painful hardware. 2. Possible left third MTP joint plantar plate tear.  POSTOPERATIVE DIAGNOSES: 1. Left third metatarsal painful hardware. 2. Left third hammertoe deformity.  PROCEDURE: 1. Left third metatarsal removal of deep implant. 2. Left third hammertoe correction with flexor to extensor transfer. 3. Open left third toe flexor tendon release.  SURGEON:  Kurt Arthurs, MD.  ANESTHESIA:  General.  ESTIMATED BLOOD LOSS:  Minimal.  TOURNIQUET TIME:  28 minutes at 250 mmHg.  COMPLICATIONS:  None apparent.  DISPOSITION:  Extubated, awake, and stable to recovery.  INDICATION FOR PROCEDURE:  The patient is a 50 year old male, who has a history of third MTP joint Weil osteotomy approximately 6 months ago. He generally did well from this surgery, but developed consistent pain on the plantar surface of his foot beneath the third metatarsal head. He presents today for removal of the screw from that metatarsal as well as exploration of the joint with possible plantar plate repair.  He understands the risks and benefits, the alternative treatment options, and elects surgical treatment.  He specifically understands risks of bleeding, infection, nerve damage, blood clots, need for additional surgery, continued pain, amputation, and death.  PROCEDURE IN DETAIL:  After preoperative consent was obtained and the correct operative site was identified, the patient was brought to the operating room and placed on the operating table.  General anesthesia was induced.  Preoperative antibiotics were administered.  Surgical time- out was  taken.  Left lower extremity was prepped and draped in standard sterile fashion with tourniquet around the thigh.  The extremity was exsanguinated, then tourniquet was inflated to 250 mmHg.  The patient's previous dorsal incision was opened again sharply.  Dissection was carried down through subcutaneous tissues.  There was extensive scarring noted around the dorsal joint capsule.  This was all sharply excised. The extensor digitorum longus tendon was mobilized.  The screw head was identified.  It was cleaned of all soft tissues using a curette. Screwdriver was then used to remove the screw in its entirety.  The MTP joint was then carefully examined.  There was some moderate synovitis noted which was sharply excised.  The plantar plate was examined and was noted to be healthy and intact.  There was still a bit of instability at the MTP joint with an early hammertoe deformity.  Decision was made at that time to proceed with hammertoe correction.  Attention was then turned to the plantar surface of the forefoot, where an oblique incision was made.  Sharp dissection was carried down through skin and subcutaneous tissues.  Flexor tendon sheath was identified and was incised longitudinally.  The flexor digitorum longus tendon was isolated.  It was then released from its insertion at the distal  phalanx and pulled out through the proximal incision.  The tendon was split longitudinally and the tendon ends were tagged with 2-0 Vicryl sutures. The split ends of the tendon were then passed in subperiosteal fashion along the base of the proximal phalanx to the dorsal incision.  The toe was held in a neutral position with the ankle held in neutral dorsiflexion.  The tendon was then repaired on the dorsum of the proximal phalanx with horizontal mattress sutures of 2-0 Vicryl.  The redundant tendon was sharply excised.  With the foot held in a simulated weightbearing position, the toe was noted to be  reduced appropriately. The wounds were irrigated copiously and closed with nylon sutures. Sterile dressings were applied followed by a compression wrap. Postoperative pain control was administered in the form of a metatarsal block with 0.5% Marcaine with epinephrine.  Tourniquet was released after application of the dressings at 28 minutes.  The patient was awakened from anesthesia and transported to the recovery room in stable condition.  FOLLOWUP PLAN:  The patient will be weightbearing as tolerated on his left foot in a flat postop shoe.  He will follow up with me in the office in 2 weeks for suture removal.  We will plan to keep him immobilized in the postop shoe for total of a month.     Kurt ArthursJohn Barak Bialecki, MD     JH/MEDQ  D:  06/18/2015  T:  06/19/2015  Job:  [id number]

## 2017-04-25 IMAGING — US US ABDOMEN COMPLETE
1 series · 14 of 25 positions shown · non-contrast
Comparison: CT scan 04/02/2012

CLINICAL DATA: [DATE] month history of right upper quadrant abdominal
pain.

EXAM:
ABDOMEN ULTRASOUND COMPLETE

[Series 1: us abdomen complete · 0.26mm/px · 14 of 147 slices shown]
[im 1/147]
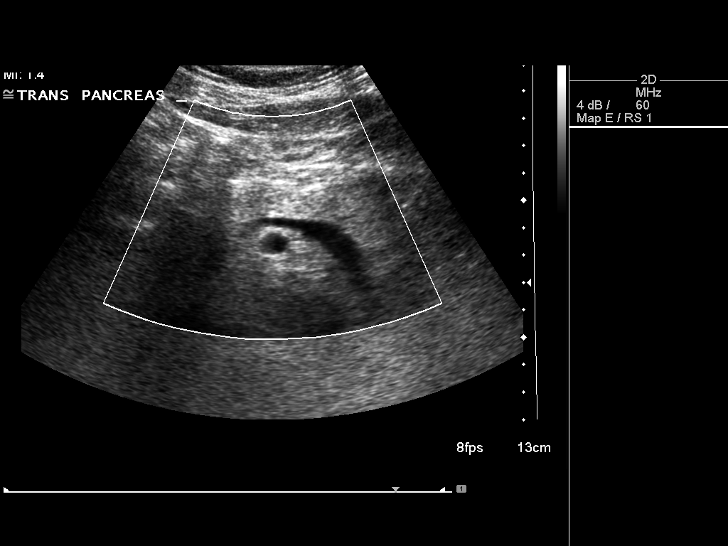
[im 13/147]
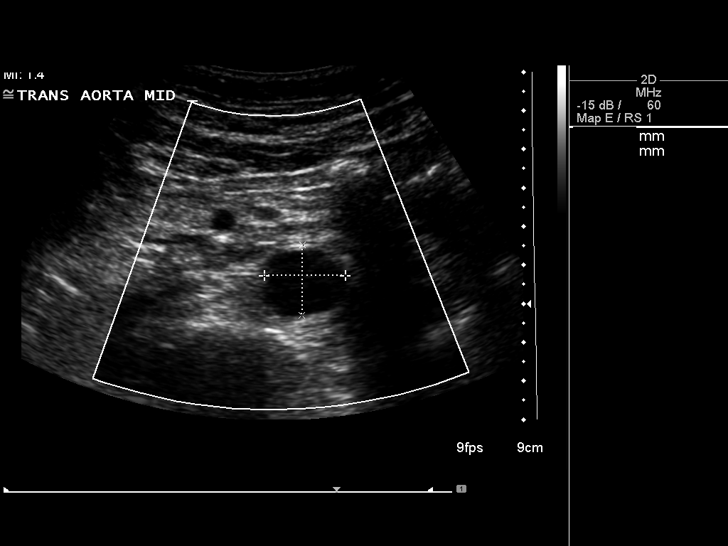
[im 25/147]
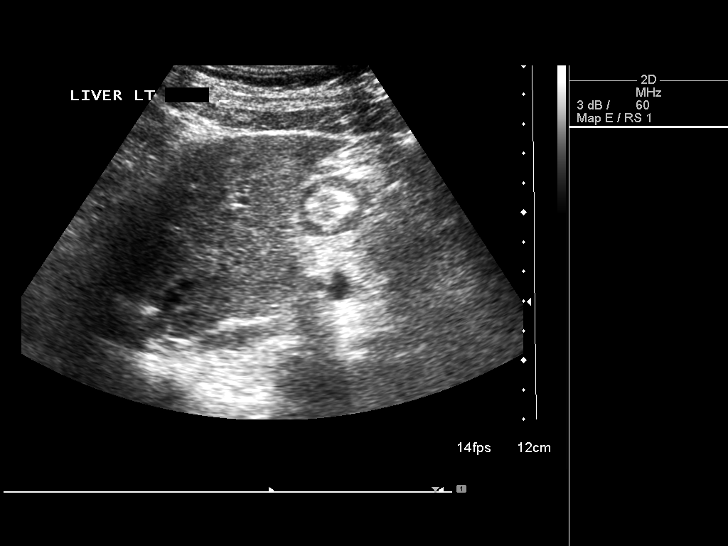
[im 37/147]
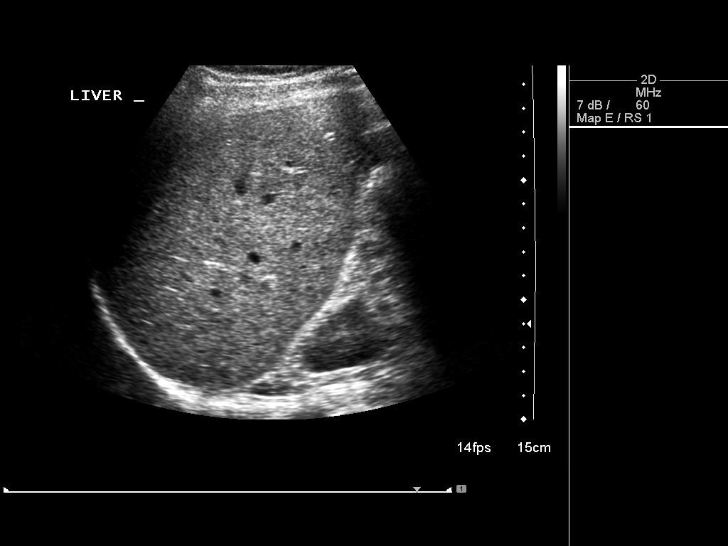
[im 49/147]
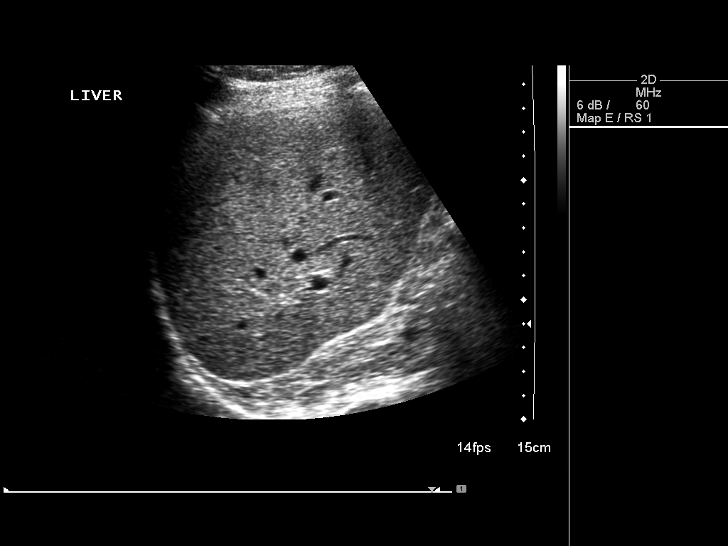
[im 55/147]
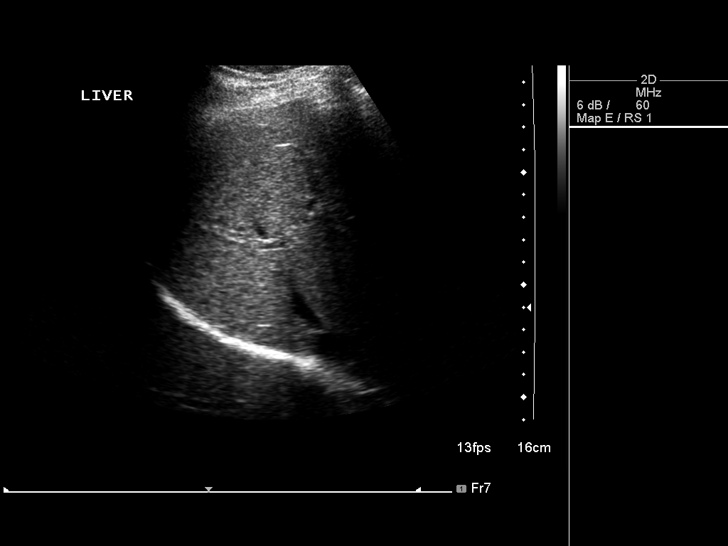
[im 67/147]
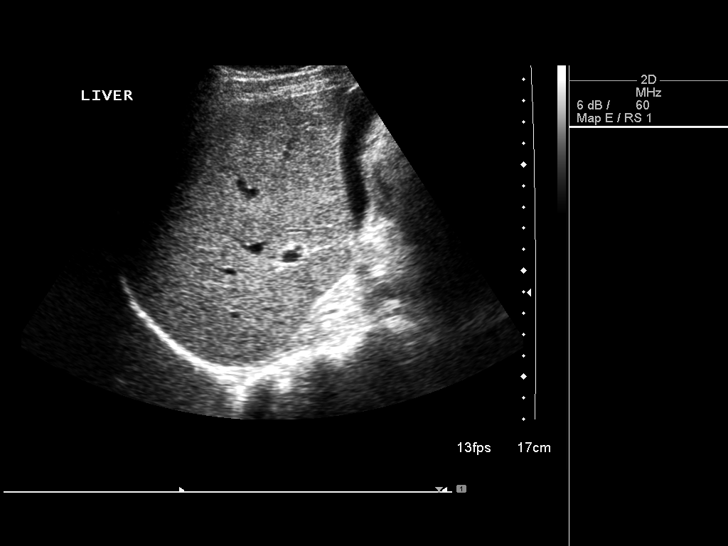
[im 80/147]
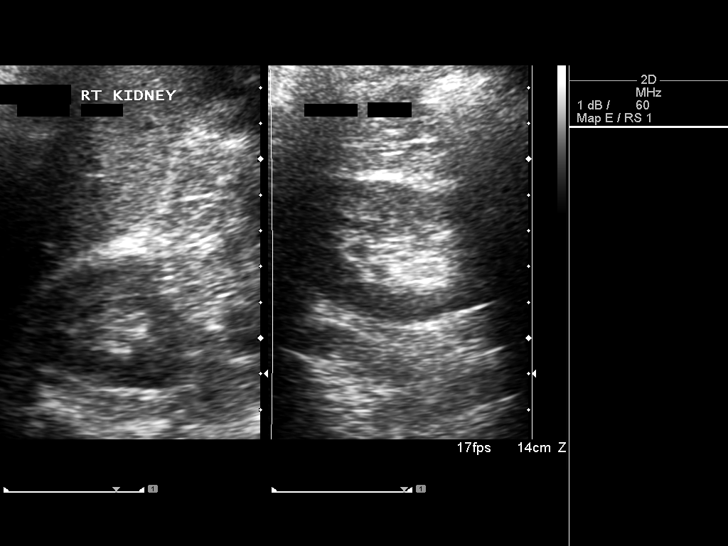
[im 92/147]
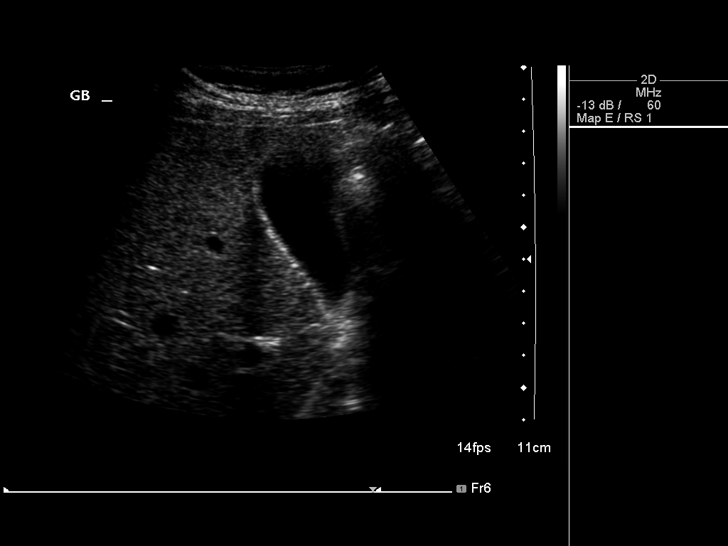
[im 98/147]
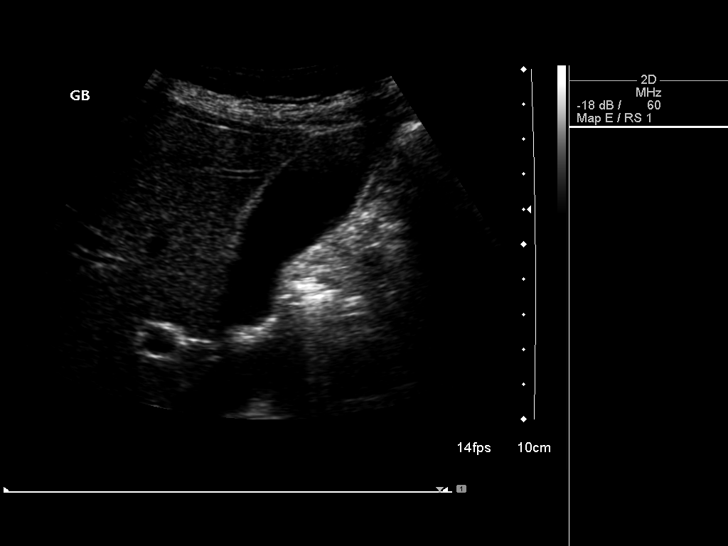
[im 110/147]
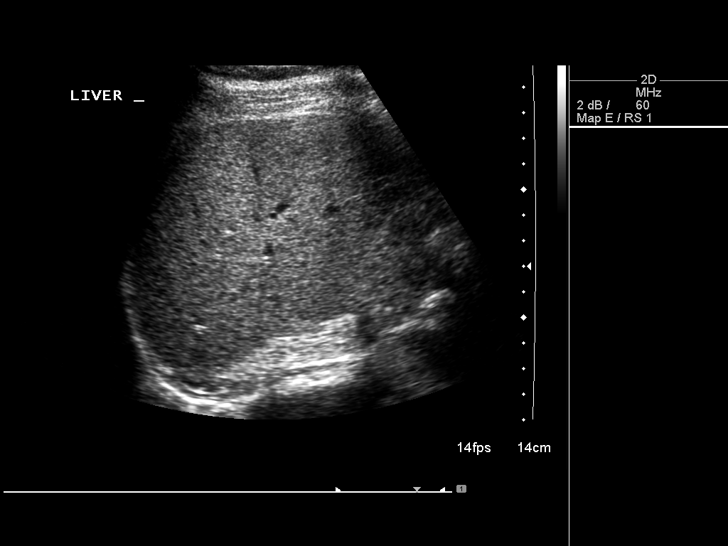
[im 122/147]
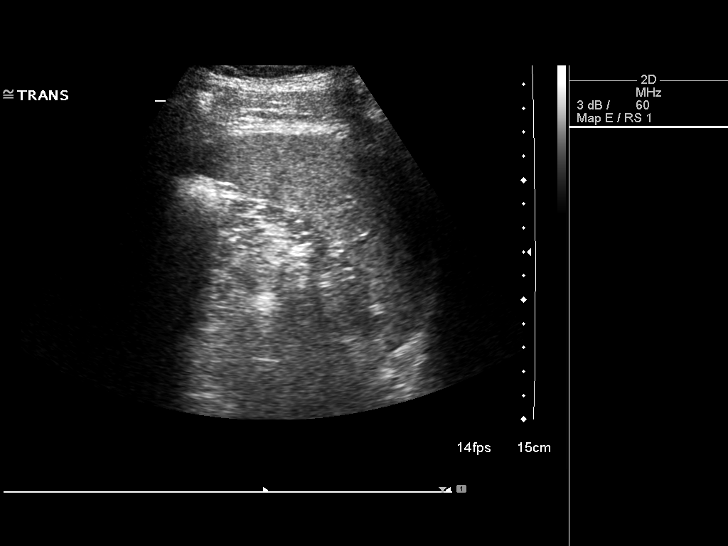
[im 134/147]
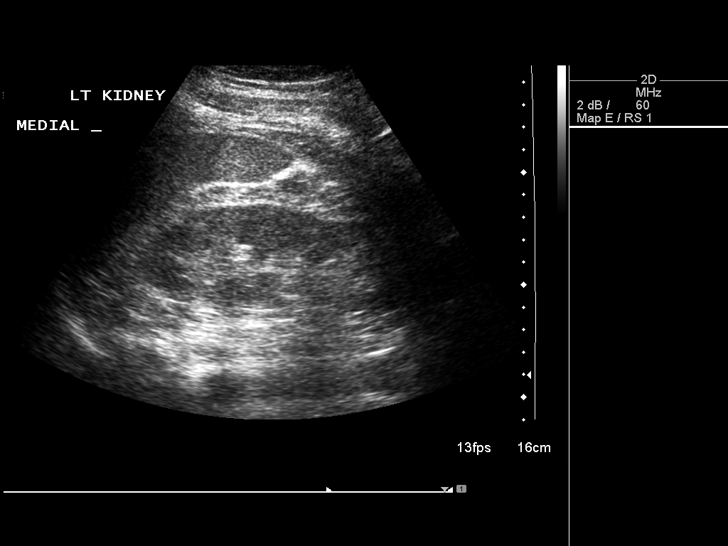
[im 147/147]
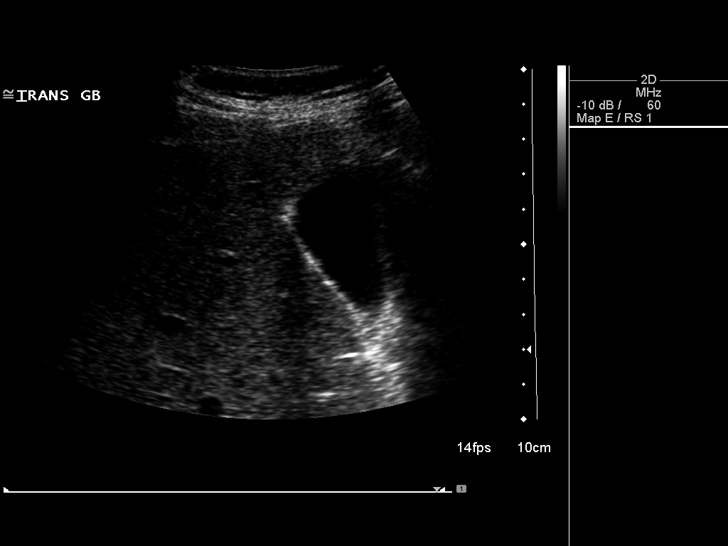

[14 of 25 positions shown; findings below may reference images not displayed]

FINDINGS: Gallbladder: No gallstones or wall thickening visualized. No
sonographic Murphy sign noted by sonographer.

Common bile duct: Diameter: 3.5 mm

Liver: Normal echogenicity without focal lesion or biliary
dilatation.

IVC: Normal caliber

Pancreas: Visualized portion unremarkable.

Spleen: Size and appearance within normal limits.

Right Kidney: Length: 11.1 cm. Normal renal cortical thickness and
echogenicity without focal lesions or hydronephrosis.

Left Kidney: Length: 12.7 cm. Normal renal cortical thickness and
echogenicity without focal lesions or hydronephrosis.

Abdominal aorta: Normal caliber

Other findings: None.
IMPRESSION: Unremarkable abdominal ultrasound examination.

## 2017-12-12 LAB — HM COLONOSCOPY

## 2018-05-23 ENCOUNTER — Ambulatory Visit
Admission: RE | Admit: 2018-05-23 | Discharge: 2018-05-23 | Disposition: A | Discharge status: Home / Self Care | Payer: BC Managed Care – PPO | Source: Ambulatory Visit | Attending: Family Medicine | Admitting: Family Medicine

## 2018-05-23 ENCOUNTER — Other Ambulatory Visit: Payer: Self-pay

## 2018-05-23 ENCOUNTER — Other Ambulatory Visit: Payer: Self-pay | Admitting: Family Medicine

## 2018-05-23 DIAGNOSIS — M79671 Pain in right foot: Secondary | ICD-10-CM

## 2019-03-02 ENCOUNTER — Ambulatory Visit: Payer: BC Managed Care – PPO

## 2019-08-23 ENCOUNTER — Encounter: Payer: Self-pay | Admitting: Cardiology

## 2019-08-23 ENCOUNTER — Other Ambulatory Visit: Payer: Self-pay

## 2019-08-23 ENCOUNTER — Ambulatory Visit: Payer: BC Managed Care – PPO | Admitting: Cardiology

## 2019-08-23 VITALS — BP 129/91 | HR 65 | Ht 72.0 in | Wt 187.4 lb

## 2019-08-23 DIAGNOSIS — I10 Essential (primary) hypertension: Secondary | ICD-10-CM

## 2019-08-23 DIAGNOSIS — I2584 Coronary atherosclerosis due to calcified coronary lesion: Secondary | ICD-10-CM

## 2019-08-23 DIAGNOSIS — Z8249 Family history of ischemic heart disease and other diseases of the circulatory system: Secondary | ICD-10-CM

## 2019-08-23 DIAGNOSIS — I2510 Atherosclerotic heart disease of native coronary artery without angina pectoris: Secondary | ICD-10-CM

## 2019-08-23 DIAGNOSIS — E782 Mixed hyperlipidemia: Secondary | ICD-10-CM

## 2019-08-23 MED ORDER — ASPIRIN EC 81 MG PO TBEC: 81.0000 mg | DELAYED_RELEASE_TABLET | Freq: Every day | ORAL | 11 refills | Status: DC

## 2019-08-23 NOTE — Progress Notes (Signed)
Date:  08/23/2019   ID:  Kurt Walker, DOB 03-Mar-1965, MRN [id number]  PCP:  Lawerance Cruel, MD  Cardiologist:  Rex Kras, DO, [facility] (established care 08/23/2019) Former Cardiology Providers: Dr. Wynonia Lawman   REASON FOR CONSULT: Atherosclerotic heart disease of native coronary artery without angina pectoris  REQUESTING PHYSICIAN:  Lawerance Cruel, [place],  [place] [address]  Chief Complaint  Patient presents with  . New Patient (Initial Visit)    Coronary artery calcification    HPI  Kurt Walker is a 54 y.o. male who presents to the office with a chief complaint of " reestablish care with cardiology history of coronary calcification." He is referred to the office at the request of Lawerance Cruel, MD. Patient's past medical history and cardiovascular risk factors include: Hypertension, Family history of coronary artery disease, hyperlipidemia, coronary artery calcification.  Based on prior office notes from outside facility patient had a coronary artery calcium score done at [facility] and his total score was 132 with predominant calcification being in the LAD distribution.  According to the notes patient underwent treadmill stress test at that time he exercised for 9 minutes and 20 seconds into stage IV Bruce protocol and achieved 90% of maximum predicted heart rate without angina pectoris and ECG negative for ischemia.  He was recommended to initiate aspirin and Crestor 20 mg p.o. nightly.  Patient now presents to the office to reestablish care with cardiology as his former cardiologist has retired.  Patient states that at times he has chest discomfort.  Chest discomfort is usually located over his anterior chest wall and shoulder girdle, occurs randomly, feels like an ache-like sensation, constant for days, intensity 4 out of 10, nonradiating, no improving or worsening factors, nonexertional and does not improve with resting.  Patient states that is most  likely due to the musculoskeletal etiology as it usually occurs after he does woodworking.  But still is concerned because he has underlying premature coronary artery disease in the family and coronary calcification.   Last cardiac evaluation was back in 2015 according to the patient.  Mom had her first myocardial infarction at the age of 16.  Denies prior history of myocardial infarction, congestive heart failure, deep venous thrombosis, pulmonary embolism, stroke, transient ischemic attack.  FUNCTIONAL STATUS: Woodworking is his hobby and taking care of kids. But no structured exercise program or daily routine.    ALLERGIES: Allergies  Allergen Reactions  . Ibuprofen     MEDICATION LIST PRIOR TO VISIT: Current Meds  Medication Sig  . acetaminophen (TYLENOL) 500 MG tablet Take 1,000 mg by mouth every 6 (six) hours as needed.  Marland Kitchen ADVAIR DISKUS 250-50 MCG/DOSE AEPB USE ONE INHALATION DAILY (DISCARD 30 DAYS AFTER OPENING) RINSE MOUTH  . HYDROcodone-acetaminophen (NORCO) 5-325 MG tablet Take 1-2 tablets by mouth every 6 (six) hours as needed.  . mometasone (NASONEX) 50 MCG/ACT nasal spray Place 1 spray into the nose daily.  . Multiple Vitamin (MULTIVITAMIN WITH MINERALS) TABS tablet Take 1 tablet by mouth daily.  . ramipril (ALTACE) 5 MG capsule Take 5 mg by mouth daily.  . rosuvastatin (CRESTOR) 20 MG tablet Take 20 mg by mouth daily.  . valACYclovir (VALTREX) 500 MG tablet Take 500 mg by mouth daily as needed.     PAST MEDICAL HISTORY: Past Medical History:  Diagnosis Date  . Allergic rhinitis   . Asthma   . Coronary artery calcification of native artery   . GERD (  gastroesophageal reflux disease)   . High cholesterol   . Hypertension   . Kidney stones     PAST SURGICAL HISTORY: Past Surgical History:  Procedure Laterality Date  . HARDWARE REMOVAL Left 06/18/2015   Procedure: LEFT THIRD METATARSAL REMOVAL OF DEEP IMPLANT;  Surgeon: Wylene Simmer, MD;  Location: [facility];  Service: Orthopedics;  Laterality: Left;  . METATARSAL OSTEOTOMY Left 01/01/2015   Procedure: LEFT THIRD METATARSAL HEAD OSTEOTOMY ;  Surgeon: Wylene Simmer, MD;  Location: [facility];  Service: Orthopedics;  Laterality: Left;  . NOSE SURGERY     reset fx and cosmetic  . TENDON TRANSFER Left 06/18/2015   Procedure: LEFT FLEXOR TO EXTENSOR TRANSFER;  Surgeon: Wylene Simmer, MD;  Location: [facility];  Service: Orthopedics;  Laterality: Left;    FAMILY HISTORY: The patient family history includes Healthy in his father; Heart attack in his mother.  SOCIAL HISTORY:  The patient  reports that he has never smoked. He has never used smokeless tobacco. He reports current alcohol use. He reports that he does not use drugs.  REVIEW OF SYSTEMS: Review of Systems  Constitutional: Negative for chills and fever.  HENT: Negative for hoarse voice and nosebleeds.   Eyes: Negative for discharge, double vision and pain.  Cardiovascular: Negative for chest pain, claudication, dyspnea on exertion, leg swelling, near-syncope, orthopnea, palpitations, paroxysmal nocturnal dyspnea and syncope.  Respiratory: Negative for hemoptysis and shortness of breath.   Musculoskeletal: Negative for muscle cramps and myalgias.  Gastrointestinal: Negative for abdominal pain, constipation, diarrhea, hematemesis, hematochezia, melena, nausea and vomiting.  Neurological: Positive for dizziness. Negative for light-headedness.   PHYSICAL EXAM: Vitals with BMI 08/23/2019 06/18/2015 06/18/2015  Height '6\' 0"'  - -  Weight 187 lbs 6 oz - -  BMI 25.36 - -  Systolic 644 034 742  Diastolic 91 74 83  Pulse 65 60 60    CONSTITUTIONAL: Well-developed and well-nourished. No acute distress.  SKIN: Skin is warm and dry. No rash noted. No cyanosis. No pallor. No jaundice HEAD: Normocephalic and atraumatic.  EYES: No scleral icterus MOUTH/THROAT: Moist oral membranes.  NECK: No JVD present. No  thyromegaly noted. No carotid bruits  LYMPHATIC: No visible cervical adenopathy.  CHEST Normal respiratory effort. No intercostal retractions  LUNGS: Clear to auscultation bilaterally.  No stridor. No wheezes. No rales.  CARDIOVASCULAR: Regular rate and rhythm, positive S1-S2, no murmurs rubs or gallops appreciated. ABDOMINAL: No apparent ascites.  EXTREMITIES: No peripheral edema  HEMATOLOGIC: No significant bruising NEUROLOGIC: Oriented to person, place, and time. Nonfocal. Normal muscle tone.  PSYCHIATRIC: Normal mood and affect. Normal behavior. Cooperative  CARDIAC DATABASE: EKG: 08/23/2019: Normal sinus rhythm, 61 bpm, normal axis, without underlying ischemia or injury pattern.  Echocardiogram: None  Stress Testing: 5 years ago at outside facility.   Heart Catheterization: None  LABORATORY DATA: External Labs: Collected: 07/29/2019 Creatinine 0.9 mg/dL. eGFR: 88 mL/min per 1.73 m Lipid profile: Total cholesterol 180, triglycerides 80, HDL 65, LDL 100, non-HDL 115  IMPRESSION:    ICD-10-CM   1. 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 Family history of premature CAD  Z82.49 aspirin EC 81 MG tablet    PCV MYOCARDIAL PERFUSION WO LEXISCAN  3. Essential hypertension  I10 EKG 12-Lead    PCV ECHOCARDIOGRAM COMPLETE  4. Mixed hyperlipidemia  E78.2      RECOMMENDATIONS: Kurt Walker  Kurt Walker is a 54 y.o. male whose past medical history and cardiac risk factors include: Hypertension, Family history of coronary artery disease, hyperlipidemia, coronary artery calcification.  Coronary atherosclerosis due to calcified coronary lesions in the native arteries:  Based on outside records he has had coronary artery calcification score of 132 AU predominantly LAD distribution.  Subsequently underwent a GXT which was reported to be normal.  He was started on aspirin and statin  therapy.  Patient continues to be on Crestor 20 mg p.o. nightly.  However he stopped taking aspirin.  Recommended that he continue aspirin 81 mg p.o. daily and statin therapy.  We will plan exercise nuclear stress test to evaluate for exercise-induced ischemia.  Echocardiogram will be ordered to evaluate for structural heart disease and left ventricular systolic function.  Educated on improving modifiable cardiovascular risk factors.  Patient is currently working with his primary care physician for the management of hypertension and hyperlipidemia.  Recommend a blood pressure of 120/80 mmHg and LDL less than 70 mg/dL.  Family history of premature coronary artery disease: See above.  Benign essential hypertension: Medications reconciled.  Recommended having blood pressure around 120/80 mmHg.  Low-salt diet recommended.  Recommended looking into the DASH diet and work plant-based diet to help improve his modifiable cardiovascular risk factors.  Mixed hyperlipidemia . Continue statin therapy.   . Follow lipids.  Recommend goal LDL < 20m/dL . Currently managed by primary care provider. . Patient denies myalgia or other side effects.  FINAL MEDICATION LIST END OF ENCOUNTER: Meds ordered this encounter  Medications  . aspirin EC 81 MG tablet    Sig: Take 1 tablet (81 mg total) by mouth daily. Swallow whole.    Dispense:  30 tablet    Refill:  11     Current Outpatient Medications:  .  acetaminophen (TYLENOL) 500 MG tablet, Take 1,000 mg by mouth every 6 (six) hours as needed., Disp: , Rfl:  .  ADVAIR DISKUS 250-50 MCG/DOSE AEPB, USE ONE INHALATION DAILY (DISCARD 30 DAYS AFTER OPENING) RINSE MOUTH, Disp: 3 each, Rfl: 0 .  HYDROcodone-acetaminophen (NORCO) 5-325 MG tablet, Take 1-2 tablets by mouth every 6 (six) hours as needed., Disp: 20 tablet, Rfl: 0 .  mometasone (NASONEX) 50 MCG/ACT nasal spray, Place 1 spray into the nose daily., Disp: , Rfl:  .  Multiple Vitamin (MULTIVITAMIN WITH  MINERALS) TABS tablet, Take 1 tablet by mouth daily., Disp: , Rfl:  .  ramipril (ALTACE) 5 MG capsule, Take 5 mg by mouth daily., Disp: , Rfl:  .  rosuvastatin (CRESTOR) 20 MG tablet, Take 20 mg by mouth daily., Disp: , Rfl:  .  valACYclovir (VALTREX) 500 MG tablet, Take 500 mg by mouth daily as needed., Disp: , Rfl:  .  aspirin EC 81 MG tablet, Take 1 tablet (81 mg total) by mouth daily. Swallow whole., Disp: 30 tablet, Rfl: 11  Orders Placed This Encounter  Procedures  . PCV MYOCARDIAL PERFUSION WO LEXISCAN  . EKG 12-Lead  . PCV ECHOCARDIOGRAM COMPLETE    There are no Patient Instructions on file for this visit.   --Continue cardiac medications as reconciled in final medication list. --Return in about 6 weeks (around 10/04/2019) for review test results., followup CAD. Or sooner if needed. --Continue follow-up with your primary care physician regarding the management of your other chronic comorbid conditions.  Total time spent: 60 minutes.  Reviewed prior office notes from former cardiology, primary notes, external labs independently reviewed, formulating plan of care and  discussing disease management and coordination of care with the patient.  Patient's questions and concerns were addressed to his satisfaction. He voices understanding of the instructions provided during this encounter.   This note was created using a voice recognition software as a result there may be grammatical errors inadvertently enclosed that do not reflect the nature of this encounter. Every attempt is made to correct such errors.  Rex Kras, [place], [facility]  Pager: [phone] Office: [phone]

## 2019-09-02 ENCOUNTER — Ambulatory Visit: Payer: BC Managed Care – PPO

## 2019-09-02 ENCOUNTER — Other Ambulatory Visit: Payer: Self-pay

## 2019-09-02 DIAGNOSIS — I2584 Coronary atherosclerosis due to calcified coronary lesion: Secondary | ICD-10-CM

## 2019-09-02 DIAGNOSIS — I2510 Atherosclerotic heart disease of native coronary artery without angina pectoris: Secondary | ICD-10-CM

## 2019-09-02 DIAGNOSIS — Z8249 Family history of ischemic heart disease and other diseases of the circulatory system: Secondary | ICD-10-CM

## 2019-09-06 ENCOUNTER — Ambulatory Visit: Payer: BC Managed Care – PPO

## 2019-09-06 DIAGNOSIS — I10 Essential (primary) hypertension: Secondary | ICD-10-CM

## 2019-09-11 ENCOUNTER — Ambulatory Visit: Payer: BC Managed Care – PPO | Admitting: Cardiology

## 2019-09-17 ENCOUNTER — Encounter: Payer: Self-pay | Admitting: Cardiology

## 2019-09-17 ENCOUNTER — Other Ambulatory Visit: Payer: Self-pay

## 2019-09-17 ENCOUNTER — Ambulatory Visit: Payer: BC Managed Care – PPO | Admitting: Cardiology

## 2019-09-17 VITALS — BP 127/78 | HR 66 | Resp 16 | Ht 72.0 in | Wt 185.8 lb

## 2019-09-17 DIAGNOSIS — I2510 Atherosclerotic heart disease of native coronary artery without angina pectoris: Secondary | ICD-10-CM

## 2019-09-17 DIAGNOSIS — I2584 Coronary atherosclerosis due to calcified coronary lesion: Secondary | ICD-10-CM

## 2019-09-17 DIAGNOSIS — E782 Mixed hyperlipidemia: Secondary | ICD-10-CM

## 2019-09-17 DIAGNOSIS — I10 Essential (primary) hypertension: Secondary | ICD-10-CM

## 2019-09-17 DIAGNOSIS — Z8249 Family history of ischemic heart disease and other diseases of the circulatory system: Secondary | ICD-10-CM

## 2019-09-17 DIAGNOSIS — Z712 Person consulting for explanation of examination or test findings: Secondary | ICD-10-CM

## 2019-09-17 NOTE — Progress Notes (Signed)
Date:  09/17/2019   ID:  Kurt Walker, DOB 1965-09-16, MRN [id number]  PCP:  Kurt Cruel, MD  Cardiologist:  Kurt Kras, DO, [facility] (established care 08/23/2019) Former Cardiology Providers: Dr. Wynonia Walker   Date: 09/17/19 Last Office Visit: 08/23/2019  Chief Complaint  Patient presents with  . Coronary Artery Disease  . Results    Echocardiogram  . Follow-up    6 week    HPI  Kurt Walker is a 54 y.o. male who presents to the office with a chief complaint of " review test results." Patient's past medical history and cardiovascular risk factors include: Hypertension, Family history of coronary artery disease, hyperlipidemia, coronary artery calcification.  Patient is accompanied by his wife Kurt Walker over the phone during today's office visit.  Patient was referred to the office at the request of his primary care provider for evaluation of atherosclerotic heart disease of the native coronary arteries.   Based on prior office notes from outside facility patient had a coronary artery calcium score done at [facility] and his total score was 132 with predominant calcification being in the LAD distribution.  After which he underwent treadmill stress test and was started on aspirin and statin therapy.    At the last office visit he was concerned about chest discomfort that appeared to be atypical in nature.  However since he has had premature coronary artery disease in the family and having coronary artery calcification at relatively young age he was concerned and came in for further evaluation and management.  Since last office visit he was recommended to undergo an echocardiogram and stress test.  Echocardiogram notes preserved left ventricular systolic function and normal diastolic function with mild valvular heart disease.  Nuclear stress test overall low risk and patient had good functional capacity for age details noted below for further reference  Mom had her first myocardial  infarction at the age of 8.  Denies prior history of myocardial infarction, congestive heart failure, deep venous thrombosis, pulmonary embolism, stroke, transient ischemic attack.  FUNCTIONAL STATUS: Woodworking is his hobby and taking care of kids. But no structured exercise program or daily routine.    ALLERGIES: Allergies  Allergen Reactions  . Ibuprofen     MEDICATION LIST PRIOR TO VISIT: Current Meds  Medication Sig  . acetaminophen (TYLENOL) 500 MG tablet Take 1,000 mg by mouth every 6 (six) hours as needed.  Marland Kitchen ADVAIR DISKUS 250-50 MCG/DOSE AEPB USE ONE INHALATION DAILY (DISCARD 30 DAYS AFTER OPENING) RINSE MOUTH  . aspirin EC 81 MG tablet Take 1 tablet (81 mg total) by mouth daily. Swallow whole.  Marland Kitchen HYDROcodone-acetaminophen (NORCO) 5-325 MG tablet Take 1-2 tablets by mouth every 6 (six) hours as needed.  . mometasone (NASONEX) 50 MCG/ACT nasal spray Place 1 spray into the nose daily.  . Multiple Vitamin (MULTIVITAMIN WITH MINERALS) TABS tablet Take 1 tablet by mouth daily.  . ramipril (ALTACE) 5 MG capsule Take 5 mg by mouth daily.  . rosuvastatin (CRESTOR) 20 MG tablet Take 20 mg by mouth daily.  . valACYclovir (VALTREX) 500 MG tablet Take 500 mg by mouth daily as needed.     PAST MEDICAL HISTORY: Past Medical History:  Diagnosis Date  . Allergic rhinitis   . Asthma   . Coronary artery calcification of native artery   . GERD (gastroesophageal reflux disease)   . High cholesterol   . Hypertension   . Kidney stones     PAST SURGICAL HISTORY: Past Surgical History:  Procedure Laterality Date  . HARDWARE REMOVAL Left 06/18/2015   Procedure: LEFT THIRD METATARSAL REMOVAL OF DEEP IMPLANT;  Surgeon: Kurt Simmer, MD;  Location: [facility];  Service: Orthopedics;  Laterality: Left;  . METATARSAL OSTEOTOMY Left 01/01/2015   Procedure: LEFT THIRD METATARSAL HEAD OSTEOTOMY ;  Surgeon: Kurt Simmer, MD;  Location: [facility];  Service:  Orthopedics;  Laterality: Left;  . NOSE SURGERY     reset fx and cosmetic  . TENDON TRANSFER Left 06/18/2015   Procedure: LEFT FLEXOR TO EXTENSOR TRANSFER;  Surgeon: Kurt Simmer, MD;  Location: [facility];  Service: Orthopedics;  Laterality: Left;    FAMILY HISTORY: The patient family history includes Healthy in his father; Heart attack in his mother.  SOCIAL HISTORY:  The patient  reports that he has never smoked. He has never used smokeless tobacco. He reports current alcohol use. He reports that he does not use drugs.  REVIEW OF SYSTEMS: Review of Systems  Constitutional: Negative for chills and fever.  HENT: Negative for hoarse voice and nosebleeds.   Eyes: Negative for discharge, double vision and pain.  Cardiovascular: Negative for chest pain, claudication, dyspnea on exertion, leg swelling, near-syncope, orthopnea, palpitations, paroxysmal nocturnal dyspnea and syncope.  Respiratory: Negative for hemoptysis and shortness of breath.   Musculoskeletal: Negative for muscle cramps and myalgias.  Gastrointestinal: Negative for abdominal pain, constipation, diarrhea, hematemesis, hematochezia, melena, nausea and vomiting.  Neurological: Negative for dizziness and light-headedness.   PHYSICAL EXAM: Vitals with BMI 09/17/2019 08/23/2019 06/18/2015  Height '6\' 0"'  '6\' 0"'  -  Weight 185 lbs 13 oz 187 lbs 6 oz -  BMI 68.37 29.02 -  Systolic 111 552 080  Diastolic 78 91 74  Pulse 66 65 60    CONSTITUTIONAL: Well-developed and well-nourished. No acute distress.  SKIN: Skin is warm and dry. No rash noted. No cyanosis. No pallor. No jaundice HEAD: Normocephalic and atraumatic.  EYES: No scleral icterus MOUTH/THROAT: Moist oral membranes.  NECK: No JVD present. No thyromegaly noted. No carotid bruits  LYMPHATIC: No visible cervical adenopathy.  CHEST Normal respiratory effort. No intercostal retractions  LUNGS: Clear to auscultation bilaterally.  No stridor. No wheezes. No rales.   CARDIOVASCULAR: Regular rate and rhythm, positive S1-S2, no murmurs rubs or gallops appreciated. ABDOMINAL: No apparent ascites.  EXTREMITIES: No peripheral edema  HEMATOLOGIC: No significant bruising NEUROLOGIC: Oriented to person, place, and time. Nonfocal. Normal muscle tone.  PSYCHIATRIC: Normal mood and affect. Normal behavior. Cooperative  CARDIAC DATABASE: EKG: 08/23/2019: Normal sinus rhythm, 61 bpm, normal axis, without underlying ischemia or injury pattern.  Echocardiogram: 09/06/2019:  Left ventricle cavity is normal in size. Mild concentric hypertrophy of  the left ventricle. Normal global wall motion. Normal LV systolic function  with EF 55%. Normal diastolic filling pattern.  Mild (Grade I) mitral regurgitation.  Mild tricuspid regurgitation.  No evidence of pulmonary hypertension.  Stress Testing: Exercise Sestamibi stress test 09/02/2019: Exercise nuclear stress test was performed using Bruce protocol.  Patient exercised for 9 minutes 59 seconds, reached 11.7 METS, and 104% of APMHR.  Exercise capacity was excellent. Chest pain not reported. Heart rate and hemodynamic response were normal. Peak EKG demonstrated sinus tachycardia, 1.5-2 mm horizontal/upsloping ST depressions in leads II, III, aVF, V4-V. These changes are equivocal for ischemia, and resolved within 1 min in recovery.  Normal myocardial perfusion. Stress LVEF 67%. Low risk study.  Heart Catheterization: None  LABORATORY DATA: External Labs: Collected: 07/29/2019 Creatinine 0.9 mg/dL. eGFR: 88  mL/min per 1.73 m Lipid profile: Total cholesterol 180, triglycerides 80, HDL 65, LDL 100, non-HDL 115  IMPRESSION:    ICD-10-CM   1. Coronary atherosclerosis due to calcified coronary lesion of native artery  I25.10    I25.84   2. Family history of premature CAD  Z82.49   3. Essential hypertension  I10   4. Mixed hyperlipidemia  E78.2   5. Encounter to discuss test results  Z71.2       RECOMMENDATIONS: Kurt Walker is a 54 y.o. male whose past medical history and cardiac risk factors include: Hypertension, Family history of coronary artery disease, hyperlipidemia, coronary artery calcification.  Coronary atherosclerosis due to calcified coronary lesions in the native arteries:  In the past patient had a coronary artery calcification study which noted his total coronary calcium score 132 AU with predominant LAD distribution.    Since last office visit patient has not had any recurrence of his chest discomfort.    Reviewed the results of the echocardiogram and nuclear stress test with him in great detail including independently reviewing images.    Continue aspirin.  Continue statin therapy.    Given the fact that he has had coronary artery calcification at a young age would recommend aggressive lifestyle modifications with a goal LDL of less than 70 mg/dL.    Instead of increasing statin therapy to achieve a more acceptable LDL normal I encouraged him to decrease foods that are high in cholesterol and increasing physical activity as tolerated.  And to have it re-evaluated with his PCP.   Plan of care discussed with the patient's wife over the phone as well.    Family history of premature coronary artery disease: See above.  Benign essential hypertension:   Medications reconciled.    Recommended having blood pressure around 120/80 mmHg.    Low-salt diet recommended.    Recommended looking into the DASH diet and work plant-based diet to help improve his modifiable cardiovascular risk factors.  Mixed hyperlipidemia . Continue statin therapy.   . Follow lipids.  Recommend goal LDL < 65m/dL . Currently managed by primary care provider. . Patient denies myalgia or other side effects.  FINAL MEDICATION LIST END OF ENCOUNTER: No orders of the defined types were placed in this encounter.    Current Outpatient Medications:  .  acetaminophen (TYLENOL) 500 MG  tablet, Take 1,000 mg by mouth every 6 (six) hours as needed., Disp: , Rfl:  .  ADVAIR DISKUS 250-50 MCG/DOSE AEPB, USE ONE INHALATION DAILY (DISCARD 30 DAYS AFTER OPENING) RINSE MOUTH, Disp: 3 each, Rfl: 0 .  aspirin EC 81 MG tablet, Take 1 tablet (81 mg total) by mouth daily. Swallow whole., Disp: 30 tablet, Rfl: 11 .  HYDROcodone-acetaminophen (NORCO) 5-325 MG tablet, Take 1-2 tablets by mouth every 6 (six) hours as needed., Disp: 20 tablet, Rfl: 0 .  mometasone (NASONEX) 50 MCG/ACT nasal spray, Place 1 spray into the nose daily., Disp: , Rfl:  .  Multiple Vitamin (MULTIVITAMIN WITH MINERALS) TABS tablet, Take 1 tablet by mouth daily., Disp: , Rfl:  .  ramipril (ALTACE) 5 MG capsule, Take 5 mg by mouth daily., Disp: , Rfl:  .  rosuvastatin (CRESTOR) 20 MG tablet, Take 20 mg by mouth daily., Disp: , Rfl:  .  valACYclovir (VALTREX) 500 MG tablet, Take 500 mg by mouth daily as needed., Disp: , Rfl:   No orders of the defined types were placed in this encounter.   There are no Patient Instructions on  file for this visit.   --Continue cardiac medications as reconciled in final medication list. --Return in about 1 year (around 09/16/2020) for CAD follow up . Or sooner if needed. --Continue follow-up with your primary care physician regarding the management of your other chronic comorbid conditions.  Patient's questions and concerns were addressed to his satisfaction. He voices understanding of the instructions provided during this encounter.   This note was created using a voice recognition software as a result there may be grammatical errors inadvertently enclosed that do not reflect the nature of this encounter. Every attempt is made to correct such errors.  Kurt Walker, [place], [facility]  Pager: [phone] Office: [phone]

## 2020-04-08 ENCOUNTER — Telehealth: Payer: Self-pay

## 2020-04-08 NOTE — Telephone Encounter (Signed)
Just have him bring it is yearly physical labs which include fasting lipid profile and liver function test.

## 2020-04-09 NOTE — Telephone Encounter (Signed)
Pt called back regarding labs. Pt voiced understanding.

## 2020-04-09 NOTE — Telephone Encounter (Signed)
Called patient, NA, LMAM

## 2020-08-31 IMAGING — CR RIGHT FOOT COMPLETE - 3+ VIEW
3 series · 3 of 3 positions shown · non-contrast
Comparison: None.

CLINICAL DATA: Right foot pain. Pain at the base of the fourth
metatarsophalangeal joint.

EXAM:
RIGHT FOOT COMPLETE - 3+ VIEW

[x foot ap right]
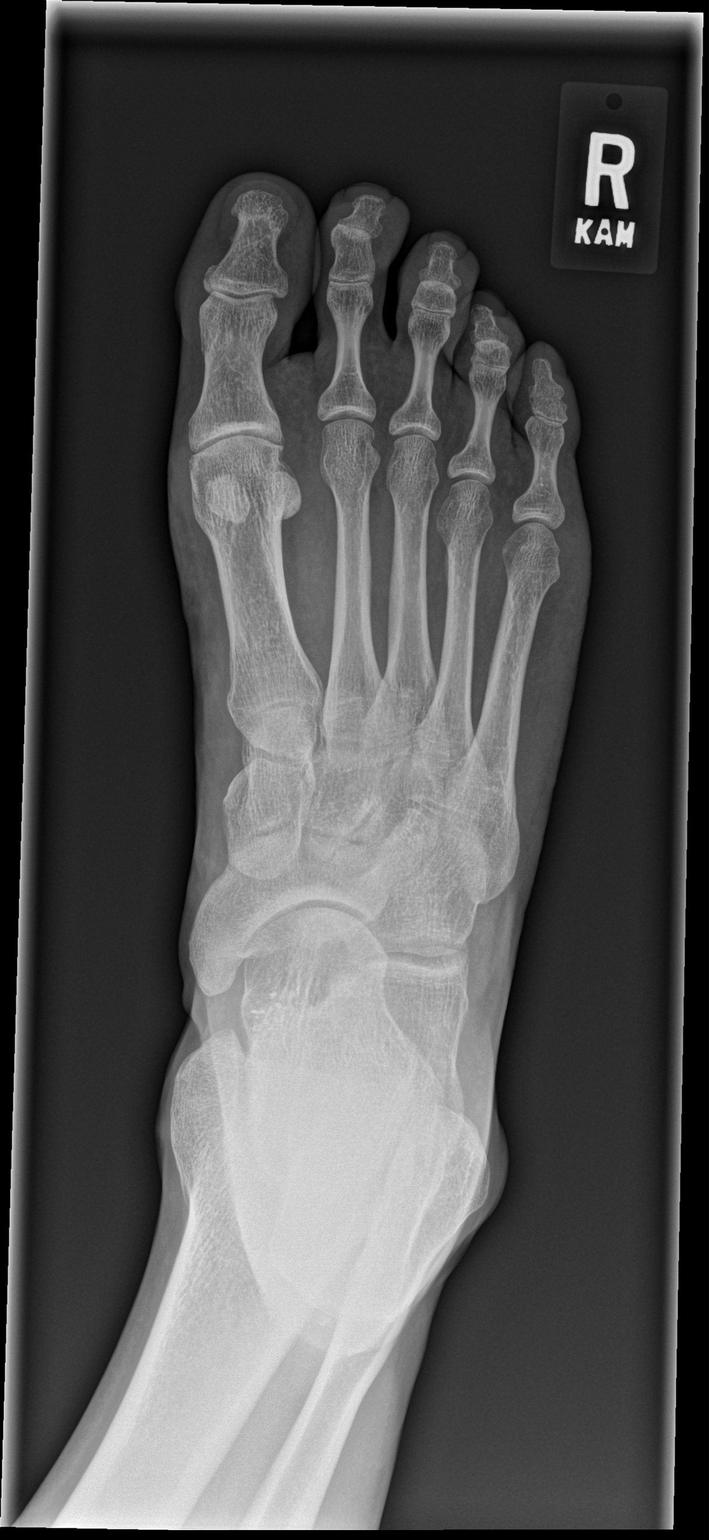

[x foot lat right]
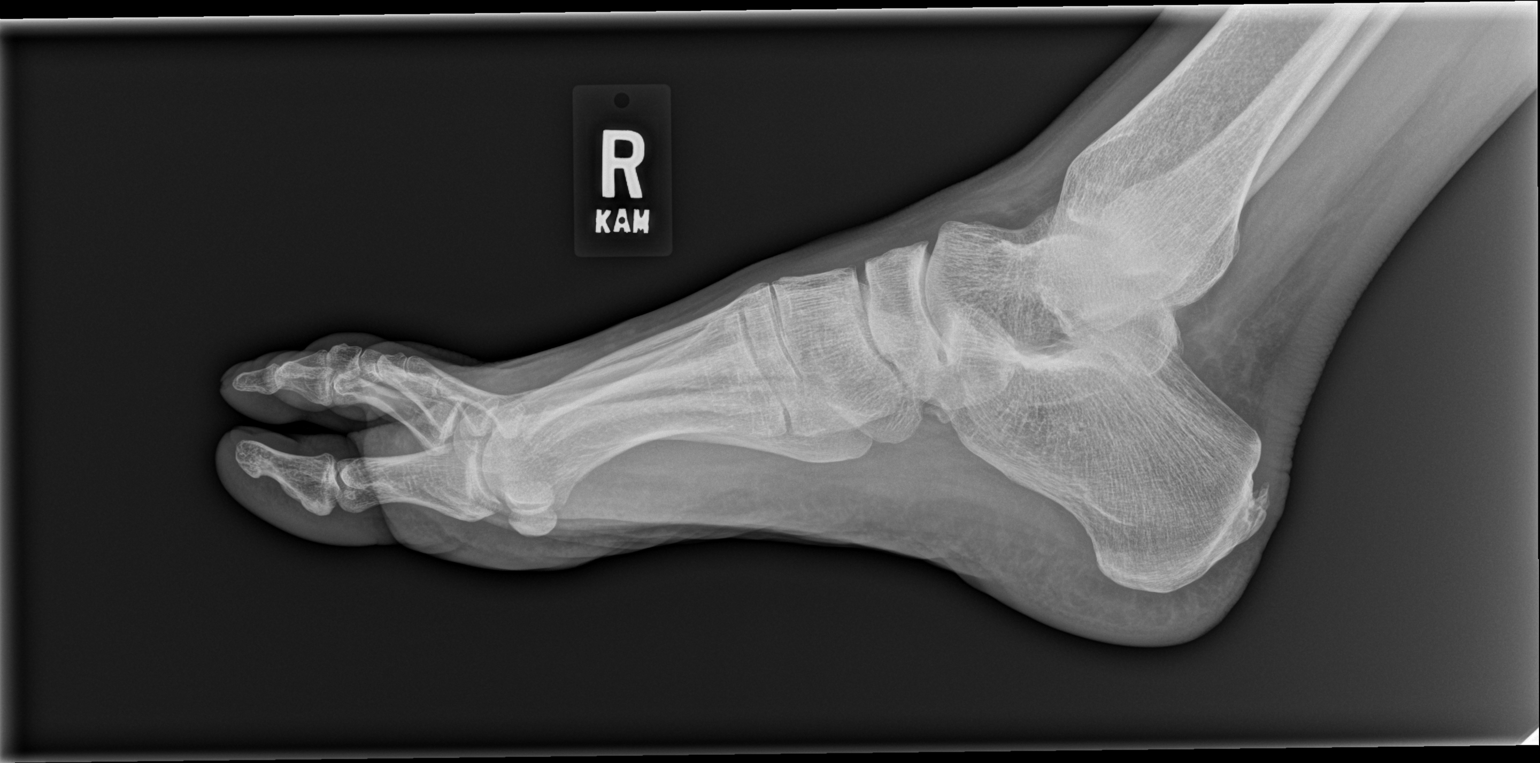

[x foot obl right]
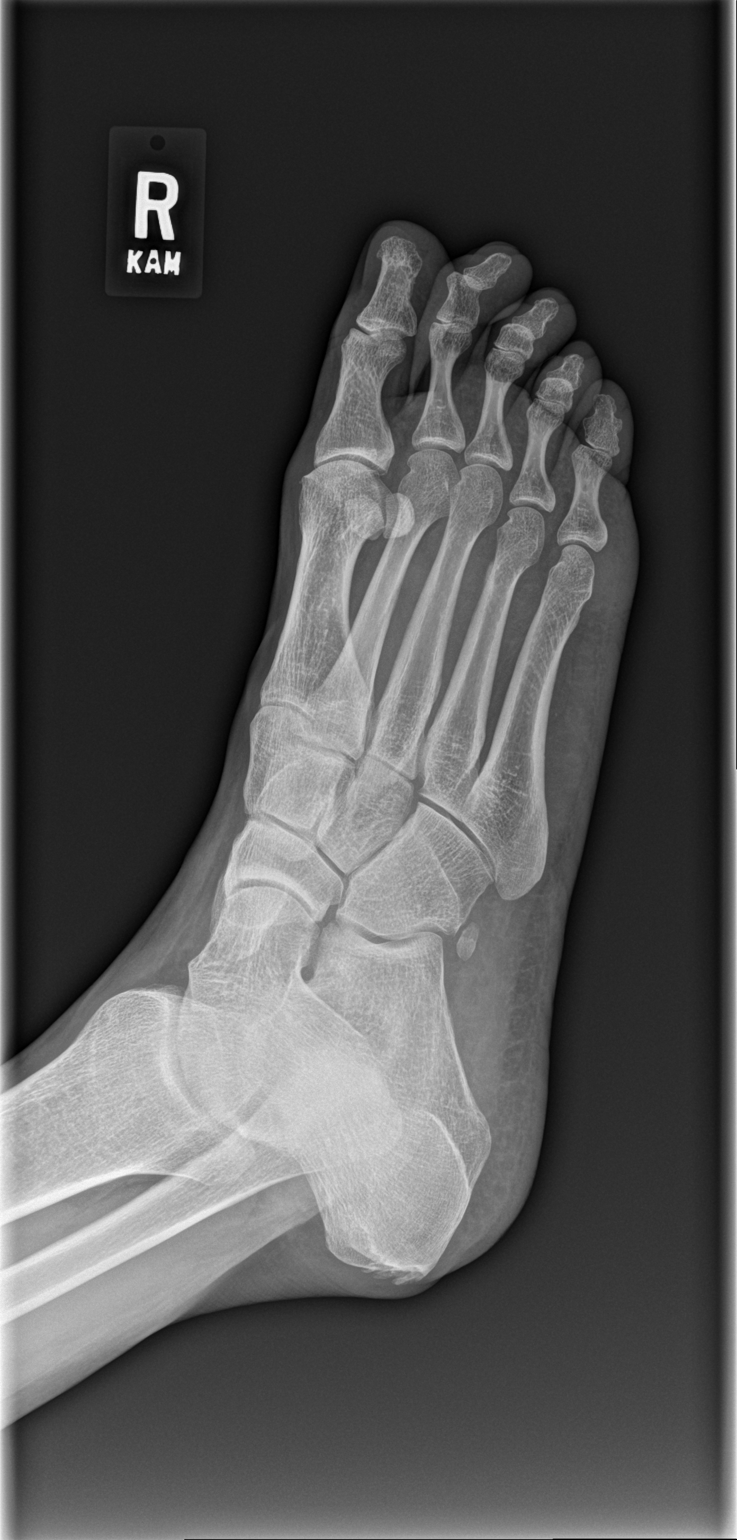

[3 of 3 positions shown; findings below may reference images not displayed]

FINDINGS: There is no evidence of fracture or dislocation. There is no
evidence of arthropathy or other focal bone abnormality. Soft
tissues are unremarkable.
IMPRESSION: Negative.

## 2020-09-16 ENCOUNTER — Ambulatory Visit: Payer: BC Managed Care – PPO | Admitting: Cardiology

## 2020-09-17 ENCOUNTER — Other Ambulatory Visit: Payer: Self-pay

## 2020-09-17 ENCOUNTER — Ambulatory Visit: Payer: BC Managed Care – PPO | Admitting: Cardiology

## 2020-09-17 ENCOUNTER — Encounter: Payer: Self-pay | Admitting: Cardiology

## 2020-09-17 VITALS — BP 144/83 | HR 61 | Resp 16 | Ht 72.0 in | Wt 177.0 lb

## 2020-09-17 DIAGNOSIS — I2510 Atherosclerotic heart disease of native coronary artery without angina pectoris: Secondary | ICD-10-CM

## 2020-09-17 DIAGNOSIS — I10 Essential (primary) hypertension: Secondary | ICD-10-CM

## 2020-09-17 DIAGNOSIS — Z8249 Family history of ischemic heart disease and other diseases of the circulatory system: Secondary | ICD-10-CM

## 2020-09-17 DIAGNOSIS — E782 Mixed hyperlipidemia: Secondary | ICD-10-CM

## 2020-09-17 MED ORDER — ROSUVASTATIN CALCIUM 20 MG PO TABS: 20.0000 mg | ORAL_TABLET | Freq: Every day | ORAL | 3 refills | Status: DC

## 2020-09-17 NOTE — Progress Notes (Signed)
Date:  09/17/2020   ID:  Kurt Walker, DOB 09-Oct-1965, MRN [id number]  PCP:  Lawerance Cruel, MD  Cardiologist:  Alethia Berthold Former Cardiology Providers: Dr. Wynonia Lawman   Date: 09/17/20 Last Office Visit: 08/23/2019  Chief Complaint  Patient presents with   Coronary Artery Disease   Follow-up   Chest Pain    HPI  Kurt Walker is a 55 y.o. male who presents to the office with a chief complaint of " review test results." Patient's past medical history and cardiovascular risk factors include: Hypertension, Family history of coronary artery disease, hyperlipidemia, coronary artery calcification.  Patient is accompanied by his wife [name] at today's visit.   Patient was referred to the office at the request of his primary care provider for evaluation of atherosclerotic heart disease of the native coronary arteries.  Patient had coronary artery calcium score done at outside facility with total score of 132 with predominant calcification in LAD.  Patient subsequently underwent stress test which was low risk and was started on aspirin and statin therapy.  Patient has recently been seen by his PCP who reportedly did lipid profile testing and recommended addition of Zetia, however patient was confused and has since stopped Crestor and is now only taking Zetia 10 mg daily.  He also notes he recently developed a cold with significant cough for several weeks and now has left-sided pleuritic chest pain in the midaxillary line which is also tender to palpation.  Patient remains active running on a relatively regular basis without issue.  He has no exertional chest pain symptoms.  Patient reports home blood pressure readings averaging 140/80 mmHg.  He admits to high sodium diet.  Mom had her first myocardial infarction at the age of 95.  Denies prior history of myocardial infarction, congestive heart failure, deep venous thrombosis, pulmonary embolism, stroke, transient ischemic  attack.  FUNCTIONAL STATUS: Woodworking is his hobby and taking care of kids. But no structured exercise program or daily routine.    ALLERGIES: Allergies  Allergen Reactions   Ibuprofen     MEDICATION LIST PRIOR TO VISIT: Current Meds  Medication Sig   acetaminophen (TYLENOL) 500 MG tablet Take 1,000 mg by mouth every 6 (six) hours as needed.   ADVAIR DISKUS 250-50 MCG/DOSE AEPB USE ONE INHALATION DAILY (DISCARD 30 DAYS AFTER OPENING) RINSE MOUTH   aspirin EC 81 MG tablet Take 1 tablet (81 mg total) by mouth daily. Swallow whole.   ezetimibe (ZETIA) 10 MG tablet Take 10 mg by mouth daily.   mometasone (NASONEX) 50 MCG/ACT nasal spray Place 1 spray into the nose daily.   Multiple Vitamin (MULTIVITAMIN WITH MINERALS) TABS tablet Take 1 tablet by mouth daily.   ramipril (ALTACE) 5 MG capsule Take 5 mg by mouth daily.   valACYclovir (VALTREX) 500 MG tablet Take 500 mg by mouth daily as needed.     PAST MEDICAL HISTORY: Past Medical History:  Diagnosis Date   Allergic rhinitis    Asthma    Coronary artery calcification of native artery    GERD (gastroesophageal reflux disease)    High cholesterol    Hypertension    Kidney stones     PAST SURGICAL HISTORY: Past Surgical History:  Procedure Laterality Date   HARDWARE REMOVAL Left 06/18/2015   Procedure: LEFT THIRD METATARSAL REMOVAL OF DEEP IMPLANT;  Surgeon: Wylene Simmer, MD;  Location: [place];  Service: Orthopedics;  Laterality: Left;   METATARSAL OSTEOTOMY Left 01/01/2015   Procedure:  LEFT THIRD METATARSAL HEAD OSTEOTOMY ;  Surgeon: Wylene Simmer, MD;  Location: [place];  Service: Orthopedics;  Laterality: Left;   NOSE SURGERY     reset fx and cosmetic   TENDON TRANSFER Left 06/18/2015   Procedure: LEFT FLEXOR TO EXTENSOR TRANSFER;  Surgeon: Wylene Simmer, MD;  Location: [place];  Service: Orthopedics;  Laterality: Left;    FAMILY HISTORY: The patient family history includes  Healthy in his father; Heart attack in his mother.  SOCIAL HISTORY:  The patient  reports that he has never smoked. He has never used smokeless tobacco. He reports current alcohol use. He reports that he does not use drugs.  REVIEW OF SYSTEMS: Review of Systems  Constitutional: Negative for chills and fever.  HENT:  Negative for hoarse voice and nosebleeds.   Eyes:  Negative for discharge, double vision and pain.  Cardiovascular:  Positive for chest pain (Left side mid axillary line, worse with inspiration and palpation). Negative for claudication, dyspnea on exertion, leg swelling, near-syncope, orthopnea, palpitations, paroxysmal nocturnal dyspnea and syncope.  Respiratory:  Negative for hemoptysis and shortness of breath.   Musculoskeletal:  Negative for muscle cramps and myalgias.  Gastrointestinal:  Negative for abdominal pain, constipation, diarrhea, hematemesis, hematochezia, melena, nausea and vomiting.  Neurological:  Negative for dizziness and light-headedness.   PHYSICAL EXAM: Vitals with BMI 09/17/2020 09/17/2019 08/23/2019  Height 6' 0" 6' 0" 6' 0"  Weight 177 lbs 185 lbs 13 oz 187 lbs 6 oz  BMI 24 57.01 77.93  Systolic 903 009 233  Diastolic 83 78 91  Pulse 61 66 65   CONSTITUTIONAL: Well-developed and well-nourished. No acute distress.  SKIN: Skin is warm and dry. No rash noted. No cyanosis. No pallor. No jaundice HEAD: Normocephalic and atraumatic.  EYES: No scleral icterus MOUTH/THROAT: Moist oral membranes.  NECK: No JVD present. No thyromegaly noted. No carotid bruits  LYMPHATIC: No visible cervical adenopathy.  CHEST Normal respiratory effort. No intercostal retractions. Mildly tender to palpation at left mid-axillary line.  LUNGS: Clear to auscultation bilaterally.  No stridor. No wheezes. No rales.  CARDIOVASCULAR: Regular rate and rhythm, positive S1-S2, no murmurs rubs or gallops appreciated. ABDOMINAL: No apparent ascites.  EXTREMITIES: No peripheral edema   HEMATOLOGIC: No significant bruising NEUROLOGIC: Oriented to person, place, and time. Nonfocal. Normal muscle tone.  PSYCHIATRIC: Normal mood and affect. Normal behavior. Cooperative  CARDIAC DATABASE: EKG: 08/23/2019: Normal sinus rhythm, 61 bpm, normal axis, without underlying ischemia or injury pattern. 09/17/2020: Sinus bradycardia rate of 57 bpm.  Normal axis.  No evidence of ischemia or underlying injury pattern.  Echocardiogram: 09/06/2019:  Left ventricle cavity is normal in size. Mild concentric hypertrophy of  the left ventricle. Normal global wall motion. Normal LV systolic function  with EF 55%. Normal diastolic filling pattern.  Mild (Grade I) mitral regurgitation.  Mild tricuspid regurgitation.  No evidence of pulmonary hypertension.  Stress Testing: Exercise Sestamibi stress test 09/02/2019: Exercise nuclear stress test was performed using Bruce protocol.  Patient exercised for 9 minutes 59 seconds, reached 11.7 METS, and 104% of APMHR.  Exercise capacity was excellent. Chest pain not reported. Heart rate and hemodynamic response were normal. Peak EKG demonstrated sinus tachycardia, 1.5-2 mm horizontal/upsloping ST depressions in leads II, III, aVF, V4-V. These changes are equivocal for ischemia, and resolved within 1 min in recovery.  Normal myocardial perfusion. Stress LVEF 67%. Low risk study.  Heart Catheterization: None  LABORATORY DATA: External Labs: Collected: 07/29/2019 Creatinine 0.9 mg/dL.  eGFR: 88 mL/min per 1.73 m Lipid profile: Total cholesterol 180, triglycerides 80, HDL 65, LDL 100, non-HDL 115  IMPRESSION:    ICD-10-CM   1. 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 Essential hypertension  I10     3. Mixed hyperlipidemia  E78.2 Lipid Panel With LDL/HDL Ratio    4. Family history of premature CAD  Z82.49        RECOMMENDATIONS: TYLEE NEWBY is a 55 y.o. male whose past  medical history and cardiac risk factors include: Hypertension, Family history of coronary artery disease, hyperlipidemia, coronary artery calcification.  Coronary atherosclerosis due to calcified coronary lesions in the native arteries: External coronary calcium score with a total of 132 predominantly in LAD distribution Patient with left-sided chest pain consistent with musculoskeletal etiology.  Recommend watchful waiting and counseled patient regarding signs and symptoms that would warrant urgent or emergent evaluation, he verbalized understanding agreement. Patient had inadvertently discontinued statin therapy.  Resume rosuvastatin 20 mg daily Continue Zetia 10 mg daily Repeat lipid profile testing in 8 weeks, LDL goal <70 mg/DL given coronary artery calcification at a young age and family history of premature CAD. Continue aspirin  Family history of premature coronary artery disease: See above.  Benign essential hypertension:  Uncontrolled Patient admits to high sodium intake.  Discussed with patient regarding option of adjusting antihypertensive medications versus reducing salt intake and reevaluating need for additional antihypertensive therapy. Shared decision was for patient to make diet changes, particularly regarding low-sodium diet. Will reevaluate blood pressure control at next office visit and consider increasing ramipril if needed.  Mixed hyperlipidemia Continue Zetia 10 mg daily. Resume statin therapy Repeat lipid profile testing in 8 weeks prior to next office visit LDL goal <70 mL/dL  FINAL MEDICATION LIST END OF ENCOUNTER: Meds ordered this encounter  Medications   rosuvastatin (CRESTOR) 20 MG tablet    Sig: Take 1 tablet (20 mg total) by mouth daily.    Dispense:  30 tablet    Refill:  3     Current Outpatient Medications:    acetaminophen (TYLENOL) 500 MG tablet, Take 1,000 mg by mouth every 6 (six) hours as needed., Disp: , Rfl:    ADVAIR DISKUS 250-50  MCG/DOSE AEPB, USE ONE INHALATION DAILY (DISCARD 30 DAYS AFTER OPENING) RINSE MOUTH, Disp: 3 each, Rfl: 0   aspirin EC 81 MG tablet, Take 1 tablet (81 mg total) by mouth daily. Swallow whole., Disp: 30 tablet, Rfl: 11   ezetimibe (ZETIA) 10 MG tablet, Take 10 mg by mouth daily., Disp: , Rfl:    mometasone (NASONEX) 50 MCG/ACT nasal spray, Place 1 spray into the nose daily., Disp: , Rfl:    Multiple Vitamin (MULTIVITAMIN WITH MINERALS) TABS tablet, Take 1 tablet by mouth daily., Disp: , Rfl:    ramipril (ALTACE) 5 MG capsule, Take 5 mg by mouth daily., Disp: , Rfl:    valACYclovir (VALTREX) 500 MG tablet, Take 500 mg by mouth daily as needed., Disp: , Rfl:    rosuvastatin (CRESTOR) 20 MG tablet, Take 1 tablet (20 mg total) by mouth daily., Disp: 30 tablet, Rfl: 3  Orders Placed This Encounter  Procedures   Lipid Panel With LDL/HDL Ratio   EKG 12-Lead     There are no Patient Instructions on file for this visit.   --Continue cardiac medications as reconciled in final medication list. --Return in about 8 weeks (around 11/12/2020) for  HTN, HLD . Or sooner if needed. --Continue follow-up with your primary care physician regarding the management of your other chronic comorbid conditions.  Patient's questions and concerns were addressed to his satisfaction. He voices understanding of the instructions provided during this encounter.   This note was created using a voice recognition software as a result there may be grammatical errors inadvertently enclosed that do not reflect the nature of this encounter. Every attempt is made to correct such errors.   Alethia Berthold, PA-C 09/17/2020, 3:13 PM Office: [phone]

## 2020-10-19 ENCOUNTER — Other Ambulatory Visit: Payer: Self-pay | Admitting: Cardiology

## 2020-10-19 DIAGNOSIS — Z8249 Family history of ischemic heart disease and other diseases of the circulatory system: Secondary | ICD-10-CM

## 2020-10-19 DIAGNOSIS — I2510 Atherosclerotic heart disease of native coronary artery without angina pectoris: Secondary | ICD-10-CM

## 2020-11-10 NOTE — Progress Notes (Signed)
Date:  11/11/2020   ID:  Kurt Walker, DOB Jul 24, 1965, MRN [id number]  PCP:  Lawerance Cruel, MD  Cardiologist:  Alethia Berthold Former Cardiology Providers: Dr. Wynonia Lawman   Date: 11/11/20 Last Office Visit: 08/23/2019  Chief Complaint  Patient presents with   Coronary Artery Disease   Follow-up   Hypertension   Hyperlipidemia    HPI  Kurt Walker is a 55 y.o. male who presents to the office with a chief complaint of " review test results." Patient's past medical history and cardiovascular risk factors include: Hypertension, Family history of coronary artery disease, hyperlipidemia, coronary artery calcification.  Patient is accompanied by his wife Kurt Walker at today's visit.   Patient was referred to the office at the request of his primary care provider for evaluation of atherosclerotic heart disease of the native coronary arteries.  Patient had coronary artery calcium score done at outside facility with total score of 132 with predominant calcification in LAD.  Patient subsequently underwent stress test which was low risk and was started on aspirin and statin therapy.  Patient presents for 8-week follow-up.  At last office visit patient had inadvertently discontinued rosuvastatin, therefore resumed. Also last office visit patient's blood pressure was uncontrolled, he opted for an opportunity to make diet and lifestyle changes prior to additional antihypertensive medications.  Patient has cut back sodium intake brings with him a written log of home blood pressure readings.  I personally reviewed home blood pressure log, which reveals pressures under excellent control.  Patient is tolerating Zetia without issue and recently had labs done by PCP which he brings with him for reference.  LDL is now 57, under excellent control.  Patient remains asymptomatic.  Denies chest pain, palpitations, dyspnea, dizziness, syncope, near syncope.  Mom had her first myocardial infarction at the age  of 71.  Denies prior history of myocardial infarction, congestive heart failure, deep venous thrombosis, pulmonary embolism, stroke, transient ischemic attack.  FUNCTIONAL STATUS: Woodworking is his hobby and taking care of kids. But no structured exercise program or daily routine.    ALLERGIES: Allergies  Allergen Reactions   Ibuprofen     MEDICATION LIST PRIOR TO VISIT: Current Meds  Medication Sig   acetaminophen (TYLENOL) 500 MG tablet Take 1,000 mg by mouth every 6 (six) hours as needed.   ADVAIR DISKUS 250-50 MCG/DOSE AEPB USE ONE INHALATION DAILY (DISCARD 30 DAYS AFTER OPENING) RINSE MOUTH   ASPIRIN LOW DOSE 81 MG EC tablet TAKE 1 TABLET (81 MG TOTAL) BY MOUTH DAILY. SWALLOW WHOLE.   ezetimibe (ZETIA) 10 MG tablet Take 10 mg by mouth daily.   mometasone (NASONEX) 50 MCG/ACT nasal spray Place 1 spray into the nose daily.   Multiple Vitamin (MULTIVITAMIN WITH MINERALS) TABS tablet Take 1 tablet by mouth daily.   ramipril (ALTACE) 5 MG capsule Take 5 mg by mouth daily.   valACYclovir (VALTREX) 500 MG tablet Take 500 mg by mouth daily as needed.   [DISCONTINUED] rosuvastatin (CRESTOR) 20 MG tablet Take 1 tablet (20 mg total) by mouth daily.     PAST MEDICAL HISTORY: Past Medical History:  Diagnosis Date   Allergic rhinitis    Asthma    Coronary artery calcification of native artery    GERD (gastroesophageal reflux disease)    High cholesterol    Hypertension    Kidney stones     PAST SURGICAL HISTORY: Past Surgical History:  Procedure Laterality Date   HARDWARE REMOVAL Left 06/18/2015   Procedure: LEFT  THIRD METATARSAL REMOVAL OF DEEP IMPLANT;  Surgeon: Wylene Simmer, MD;  Location: [place];  Service: Orthopedics;  Laterality: Left;   METATARSAL OSTEOTOMY Left 01/01/2015   Procedure: LEFT THIRD METATARSAL HEAD OSTEOTOMY ;  Surgeon: Wylene Simmer, MD;  Location: [place];  Service: Orthopedics;  Laterality: Left;   NOSE SURGERY     reset  fx and cosmetic   TENDON TRANSFER Left 06/18/2015   Procedure: LEFT FLEXOR TO EXTENSOR TRANSFER;  Surgeon: Wylene Simmer, MD;  Location: [place];  Service: Orthopedics;  Laterality: Left;    FAMILY HISTORY: The patient family history includes Healthy in his father; Heart attack in his mother.  SOCIAL HISTORY:  The patient  reports that he has never smoked. He has never used smokeless tobacco. He reports current alcohol use. He reports that he does not use drugs.  REVIEW OF SYSTEMS: Review of Systems  Constitutional: Negative for chills and fever.  HENT:  Negative for hoarse voice and nosebleeds.   Eyes:  Negative for discharge, double vision and pain.  Cardiovascular:  Negative for chest pain, claudication, dyspnea on exertion, leg swelling, near-syncope, orthopnea, palpitations, paroxysmal nocturnal dyspnea and syncope.  Respiratory:  Negative for hemoptysis and shortness of breath.   Musculoskeletal:  Negative for muscle cramps and myalgias.  Gastrointestinal:  Negative for abdominal pain, constipation, diarrhea, hematemesis, hematochezia, melena, nausea and vomiting.  Neurological:  Negative for dizziness and light-headedness.   PHYSICAL EXAM: Vitals with BMI 11/11/2020 11/11/2020 09/17/2020  Height - '6\' 0"'  '6\' 0"'   Weight - 177 lbs 177 lbs  BMI - 24 24  Systolic 295 284 132  Diastolic 91 95 83  Pulse 68 66 61   CONSTITUTIONAL: Well-developed and well-nourished. No acute distress.  SKIN: Skin is warm and dry. No rash noted. No cyanosis. No pallor. No jaundice HEAD: Normocephalic and atraumatic.  EYES: No scleral icterus MOUTH/THROAT: Moist oral membranes.  NECK: No JVD present. No thyromegaly noted. No carotid bruits  LYMPHATIC: No visible cervical adenopathy.  CHEST Normal respiratory effort. No intercostal retractions.  LUNGS: Clear to auscultation bilaterally.  No stridor. No wheezes. No rales.  CARDIOVASCULAR: Regular rate and rhythm, positive S1-S2, no  murmurs rubs or gallops appreciated. ABDOMINAL: No apparent ascites.  EXTREMITIES: No peripheral edema  HEMATOLOGIC: No significant bruising NEUROLOGIC: Oriented to person, place, and time. Nonfocal. Normal muscle tone.  PSYCHIATRIC: Normal mood and affect. Normal behavior. Cooperative  CARDIAC DATABASE: EKG: 08/23/2019: Normal sinus rhythm, 61 bpm, normal axis, without underlying ischemia or injury pattern. 09/17/2020: Sinus bradycardia rate of 57 bpm.  Normal axis.  No evidence of ischemia or underlying injury pattern.  Echocardiogram: 09/06/2019:  Left ventricle cavity is normal in size. Mild concentric hypertrophy of  the left ventricle. Normal global wall motion. Normal LV systolic function  with EF 55%. Normal diastolic filling pattern.  Mild (Grade I) mitral regurgitation.  Mild tricuspid regurgitation.  No evidence of pulmonary hypertension.  Stress Testing: Exercise Sestamibi stress test 09/02/2019: Exercise nuclear stress test was performed using Bruce protocol.  Patient exercised for 9 minutes 59 seconds, reached 11.7 METS, and 104% of APMHR.  Exercise capacity was excellent. Chest pain not reported. Heart rate and hemodynamic response were normal. Peak EKG demonstrated sinus tachycardia, 1.5-2 mm horizontal/upsloping ST depressions in leads II, III, aVF, V4-V. These changes are equivocal for ischemia, and resolved within 1 min in recovery.  Normal myocardial perfusion. Stress LVEF 67%. Low risk study.  Heart Catheterization: None  LABORATORY DATA: External  Labs: 10/30/2020:  Total cholesterol 138, HDL 69, TG 56, LDL 57   Collected: 07/29/2019 Creatinine 0.9 mg/dL. eGFR: 88 mL/min per 1.73 m Lipid profile: Total cholesterol 180, triglycerides 80, HDL 65, LDL 100, non-HDL 115  IMPRESSION:    ICD-10-CM   1. Coronary atherosclerosis due to calcified coronary lesion of native artery  I25.10    I25.84     2. Essential hypertension  I10     3. Mixed hyperlipidemia   E78.2        RECOMMENDATIONS: Kurt Walker is a 55 y.o. male whose past medical history and cardiac risk factors include: Hypertension, Family history of coronary artery disease, hyperlipidemia, coronary artery calcification.  Coronary atherosclerosis due to calcified coronary lesions in the native arteries: External coronary calcium score with a total of 132 predominantly in LAD distribution No recurrence of chest pain I personally reviewed external labs, lipids are now under excellent control. Continue Zetia and rosuvastatin Continue aspirin  Family history of premature coronary artery disease: See above.  Benign essential hypertension:  Now well controlled with dietary changes Continue ramipril 5 mg p.o. daily  Mixed hyperlipidemia Continue Zetia 10 mg daily. Continue rosuvastatin 20 mg daily LDL of 57, goal less than 70. Lipids well controlled.   FINAL MEDICATION LIST END OF ENCOUNTER: Meds ordered this encounter  Medications   rosuvastatin (CRESTOR) 20 MG tablet    Sig: Take 1 tablet (20 mg total) by mouth daily.    Dispense:  90 tablet    Refill:  3     Current Outpatient Medications:    acetaminophen (TYLENOL) 500 MG tablet, Take 1,000 mg by mouth every 6 (six) hours as needed., Disp: , Rfl:    ADVAIR DISKUS 250-50 MCG/DOSE AEPB, USE ONE INHALATION DAILY (DISCARD 30 DAYS AFTER OPENING) RINSE MOUTH, Disp: 3 each, Rfl: 0   ASPIRIN LOW DOSE 81 MG EC tablet, TAKE 1 TABLET (81 MG TOTAL) BY MOUTH DAILY. SWALLOW WHOLE., Disp: 90 tablet, Rfl: 3   ezetimibe (ZETIA) 10 MG tablet, Take 10 mg by mouth daily., Disp: , Rfl:    mometasone (NASONEX) 50 MCG/ACT nasal spray, Place 1 spray into the nose daily., Disp: , Rfl:    Multiple Vitamin (MULTIVITAMIN WITH MINERALS) TABS tablet, Take 1 tablet by mouth daily., Disp: , Rfl:    ramipril (ALTACE) 5 MG capsule, Take 5 mg by mouth daily., Disp: , Rfl:    valACYclovir (VALTREX) 500 MG tablet, Take 500 mg by mouth daily as needed.,  Disp: , Rfl:    rosuvastatin (CRESTOR) 20 MG tablet, Take 1 tablet (20 mg total) by mouth daily., Disp: 90 tablet, Rfl: 3  No orders of the defined types were placed in this encounter.  There are no Patient Instructions on file for this visit.   --Continue cardiac medications as reconciled in final medication list. --Return in about 1 year (around 11/11/2021) for CAD, HTN, HLD . Or sooner if needed. --Continue follow-up with your primary care physician regarding the management of your other chronic comorbid conditions.  Patient's questions and concerns were addressed to his satisfaction. He voices understanding of the instructions provided during this encounter.   This note was created using a voice recognition software as a result there may be grammatical errors inadvertently enclosed that do not reflect the nature of this encounter. Every attempt is made to correct such errors.   Alethia Berthold, PA-C 11/11/2020, 11:02 AM Office: [phone]

## 2020-11-11 ENCOUNTER — Encounter: Payer: Self-pay | Admitting: Student

## 2020-11-11 ENCOUNTER — Ambulatory Visit: Payer: BC Managed Care – PPO | Admitting: Student

## 2020-11-11 ENCOUNTER — Other Ambulatory Visit: Payer: Self-pay

## 2020-11-11 VITALS — BP 128/91 | HR 68 | Temp 98.0°F | Ht 72.0 in | Wt 177.0 lb

## 2020-11-11 DIAGNOSIS — I10 Essential (primary) hypertension: Secondary | ICD-10-CM

## 2020-11-11 DIAGNOSIS — E782 Mixed hyperlipidemia: Secondary | ICD-10-CM

## 2020-11-11 DIAGNOSIS — I2510 Atherosclerotic heart disease of native coronary artery without angina pectoris: Secondary | ICD-10-CM

## 2020-11-11 MED ORDER — ROSUVASTATIN CALCIUM 20 MG PO TABS: 20.0000 mg | ORAL_TABLET | Freq: Every day | ORAL | 3 refills | Status: DC

## 2021-04-15 ENCOUNTER — Other Ambulatory Visit: Payer: Self-pay

## 2021-04-15 ENCOUNTER — Encounter (HOSPITAL_BASED_OUTPATIENT_CLINIC_OR_DEPARTMENT_OTHER): Payer: Self-pay

## 2021-04-15 DIAGNOSIS — R1013 Epigastric pain: Secondary | ICD-10-CM | POA: Insufficient documentation

## 2021-04-15 DIAGNOSIS — Z7982 Long term (current) use of aspirin: Secondary | ICD-10-CM | POA: Insufficient documentation

## 2021-04-15 LAB — CBC WITH DIFFERENTIAL/PLATELET
Abs Immature Granulocytes: 0.01 10*3/uL (ref 0.00–0.07)
Basophils Absolute: 0 10*3/uL (ref 0.0–0.1)
Basophils Relative: 0 %
Eosinophils Absolute: 0 10*3/uL (ref 0.0–0.5)
Eosinophils Relative: 0 %
HCT: 45.1 % (ref 39.0–52.0)
Hemoglobin: 14.9 g/dL (ref 13.0–17.0)
Immature Granulocytes: 0 %
Lymphocytes Relative: 19 %
Lymphs Abs: 1.1 10*3/uL (ref 0.7–4.0)
MCH: 30.5 pg (ref 26.0–34.0)
MCHC: 33 g/dL (ref 30.0–36.0)
MCV: 92.2 fL (ref 80.0–100.0)
Monocytes Absolute: 0.1 10*3/uL (ref 0.1–1.0)
Monocytes Relative: 1 %
Neutro Abs: 4.8 10*3/uL (ref 1.7–7.7)
Neutrophils Relative %: 80 %
Platelets: 252 10*3/uL (ref 150–400)
RBC: 4.89 MIL/uL (ref 4.22–5.81)
RDW: 12.4 % (ref 11.5–15.5)
WBC: 5.9 10*3/uL (ref 4.0–10.5)
nRBC: 0 % (ref 0.0–0.2)

## 2021-04-15 NOTE — ED Triage Notes (Signed)
Pt arrives POV with c/o of abdominal pain beginning today. ? ?Pain is mostly generalized and in RLQ. ? ?Denies fevers, nausea, vomiting, diarrhea, constipation. ? ?Endorses some mild shortness of breath but attributes it to abdominal pain.. ? ?Currently on a Zpack and oral steroids for bronchitis that was diagnosed on Sunday. ?

## 2021-04-16 ENCOUNTER — Emergency Department (HOSPITAL_BASED_OUTPATIENT_CLINIC_OR_DEPARTMENT_OTHER): Payer: BC Managed Care – PPO

## 2021-04-16 ENCOUNTER — Emergency Department (HOSPITAL_BASED_OUTPATIENT_CLINIC_OR_DEPARTMENT_OTHER)
Admission: EM | Admit: 2021-04-16 | Discharge: 2021-04-16 | Disposition: A | Discharge status: Home / Self Care | Payer: BC Managed Care – PPO | Attending: Emergency Medicine | Admitting: Emergency Medicine

## 2021-04-16 ENCOUNTER — Encounter (HOSPITAL_BASED_OUTPATIENT_CLINIC_OR_DEPARTMENT_OTHER): Payer: Self-pay | Admitting: Emergency Medicine

## 2021-04-16 DIAGNOSIS — K2970 Gastritis, unspecified, without bleeding: Secondary | ICD-10-CM

## 2021-04-16 LAB — COMPREHENSIVE METABOLIC PANEL
ALT: 34 U/L (ref 0–44)
AST: 25 U/L (ref 15–41)
Albumin: 4.7 g/dL (ref 3.5–5.0)
Alkaline Phosphatase: 53 U/L (ref 38–126)
Anion gap: 8 (ref 5–15)
BUN: 12 mg/dL (ref 6–20)
CO2: 28 mmol/L (ref 22–32)
Calcium: 9.5 mg/dL (ref 8.9–10.3)
Chloride: 103 mmol/L (ref 98–111)
Creatinine, Ser: 0.93 mg/dL (ref 0.61–1.24)
GFR, Estimated: >60 mL/min (ref >60)
Glucose, Bld: 133 mg/dL — ABNORMAL HIGH (ref 70–99)
Potassium: 4.6 mmol/L (ref 3.5–5.1)
Sodium: 139 mmol/L (ref 135–145)
Total Bilirubin: 0.6 mg/dL (ref 0.3–1.2)
Total Protein: 7.6 g/dL (ref 6.5–8.1)

## 2021-04-16 LAB — TROPONIN I (HIGH SENSITIVITY): Troponin I (High Sensitivity): 3 ng/L (ref <18)

## 2021-04-16 LAB — LIPASE, BLOOD: Lipase: 20 U/L (ref 11–51)

## 2021-04-16 MED ORDER — FAMOTIDINE 20 MG PO TABS: 20.0000 mg | ORAL_TABLET | Freq: Two times a day (BID) | ORAL | 0 refills | Status: DC

## 2021-04-16 MED ORDER — ALUM & MAG HYDROXIDE-SIMETH 200-200-20 MG/5ML PO SUSP
30.0000 mL | Freq: Once | ORAL | Status: AC
  Administered 2021-04-16: 30 mL via ORAL
  Filled 2021-04-16: qty 30

## 2021-04-16 NOTE — ED Provider Notes (Signed)
?[facility] EMERGENCY DEPT ?Provider Note ? ? ?CSN: [id number] ?Arrival date & time: 04/15/21  2307 ? ?  ? ?History ? ?Chief Complaint  ?Patient presents with  ? Abdominal Pain  ? ? ?Kurt Walker is a 56 y.o. male. ? ?The history is provided by the patient.  ?Abdominal Pain ?Pain location:  Epigastric ?Pain quality: aching and cramping   ?Pain radiates to:  Does not radiate ?Pain severity:  Moderate ?Onset quality:  Gradual ?Duration:  1 day ?Timing:  Constant ?Progression:  Partially resolved ?Chronicity:  New ?Context: not sick contacts, not suspicious food intake and not trauma   ?Relieved by:  Nothing ?Worsened by:  Nothing ?Ineffective treatments:  None tried ?Associated symptoms: no anorexia, no chest pain, no cough, no diarrhea, no dysuria, no fever, no nausea, no shortness of breath, no sore throat, no vaginal bleeding, no vaginal discharge and no vomiting   ?Risk factors: no alcohol abuse and no NSAID use   ?Patient who is on steroids and zpak and feels like his stomach hasn't been right today.  No n/v/d.  No f/c/r.  No urinary complaints.   ?  ? ?Home Medications ?Prior to Admission medications   ?Medication Sig Start Date End Date Taking? Authorizing Provider  ?acetaminophen (TYLENOL) 500 MG tablet Take 1,000 mg by mouth every 6 (six) hours as needed.    [provider]  ?ADVAIR DISKUS 250-50 MCG/DOSE AEPB USE ONE INHALATION DAILY (DISCARD 30 DAYS AFTER OPENING) RINSE MOUTH 12/14/12   Young, Clinton D, MD  ?ASPIRIN LOW DOSE 81 MG EC tablet TAKE 1 TABLET (81 MG TOTAL) BY MOUTH DAILY. SWALLOW WHOLE. 10/19/20   Odis Hollingshead, Sunit, DO  ?ezetimibe (ZETIA) 10 MG tablet Take 10 mg by mouth daily. 07/31/20   [provider]  ?mometasone (NASONEX) 50 MCG/ACT nasal spray Place 1 spray into the nose daily. 12/17/11   [provider]  ?Multiple Vitamin (MULTIVITAMIN WITH MINERALS) TABS tablet Take 1 tablet by mouth daily.    [provider]  ?ramipril (ALTACE) 5 MG capsule  Take 5 mg by mouth daily.    [provider]  ?rosuvastatin (CRESTOR) 20 MG tablet Take 1 tablet (20 mg total) by mouth daily. 11/11/20   Cantwell, Celeste C, PA-C  ?valACYclovir (VALTREX) 500 MG tablet Take 500 mg by mouth daily as needed. 08/06/19   [provider]  ?   ? ?Allergies    ?Patient has no active allergies.   ? ?Review of Systems   ?Review of Systems  ?Constitutional:  Negative for fever.  ?HENT:  Negative for facial swelling and sore throat.   ?Eyes:  Negative for redness.  ?Respiratory:  Negative for cough and shortness of breath.   ?Cardiovascular:  Negative for chest pain.  ?Gastrointestinal:  Positive for abdominal pain. Negative for anorexia, diarrhea, nausea and vomiting.  ?Genitourinary:  Negative for dysuria, vaginal bleeding and vaginal discharge.  ?Neurological:  Negative for facial asymmetry.  ?All other systems reviewed and are negative. ? ?Physical Exam ?Updated Vital Signs ?BP (!) 171/96 (BP Location: Right Arm)   Pulse 60   Temp 98.1 ?F (36.7 ?C) (Oral)   Resp 16   Ht 6' (1.829 m)   Wt 78.9 kg   SpO2 99%   BMI 23.60 kg/m?  ?Physical Exam ?Vitals and nursing note reviewed.  ?Constitutional:   ?   General: He is not in acute distress. ?   Appearance: Normal appearance.  ?HENT:  ?   Head: Normocephalic and atraumatic.  ?  Nose: Nose normal.  ?Eyes:  ?   Conjunctiva/sclera: Conjunctivae normal.  ?   Pupils: Pupils are equal, round, and reactive to light.  ?Cardiovascular:  ?   Rate and Rhythm: Normal rate and regular rhythm.  ?   Pulses: Normal pulses.  ?   Heart sounds: Normal heart sounds.  ?Pulmonary:  ?   Effort: Pulmonary effort is normal.  ?   Breath sounds: Normal breath sounds.  ?Abdominal:  ?   General: Abdomen is flat.  ?   Palpations: Abdomen is soft.  ?   Comments: Gassy throughout   ?Musculoskeletal:     ?   General: Normal range of motion.  ?   Cervical back: Normal range of motion and neck supple.  ?Skin: ?   General: Skin is warm and dry.  ?    Capillary Refill: Capillary refill takes less than 2 seconds.  ?Neurological:  ?   General: No focal deficit present.  ?   Mental Status: He is alert and oriented to person, place, and time.  ?   Deep Tendon Reflexes: Reflexes normal.  ?Psychiatric:     ?   Mood and Affect: Mood normal.     ?   Behavior: Behavior normal.  ? ? ?ED Results / Procedures / Treatments   ?Labs ?(all labs ordered are listed, but only abnormal results are displayed) ?Results for orders placed or performed during the hospital encounter of 04/16/21  ?Comprehensive metabolic panel  ?Result Value Ref Range  ? Sodium 139 135 - 145 mmol/L  ? Potassium 4.6 3.5 - 5.1 mmol/L  ? Chloride 103 98 - 111 mmol/L  ? CO2 28 22 - 32 mmol/L  ? Glucose, Bld 133 (H) 70 - 99 mg/dL  ? BUN 12 6 - 20 mg/dL  ? Creatinine, Ser 0.93 0.61 - 1.24 mg/dL  ? Calcium 9.5 8.9 - 10.3 mg/dL  ? Total Protein 7.6 6.5 - 8.1 g/dL  ? Albumin 4.7 3.5 - 5.0 g/dL  ? AST 25 15 - 41 U/L  ? ALT 34 0 - 44 U/L  ? Alkaline Phosphatase 53 38 - 126 U/L  ? Total Bilirubin 0.6 0.3 - 1.2 mg/dL  ? GFR, Estimated >60 >60 mL/min  ? Anion gap 8 5 - 15  ?Lipase, blood  ?Result Value Ref Range  ? Lipase 20 11 - 51 U/L  ?CBC with Diff  ?Result Value Ref Range  ? WBC 5.9 4.0 - 10.5 K/uL  ? RBC 4.89 4.22 - 5.81 MIL/uL  ? Hemoglobin 14.9 13.0 - 17.0 g/dL  ? HCT 45.1 39.0 - 52.0 %  ? MCV 92.2 80.0 - 100.0 fL  ? MCH 30.5 26.0 - 34.0 pg  ? MCHC 33.0 30.0 - 36.0 g/dL  ? RDW 12.4 11.5 - 15.5 %  ? Platelets 252 150 - 400 K/uL  ? nRBC 0.0 0.0 - 0.2 %  ? Neutrophils Relative % 80 %  ? Neutro Abs 4.8 1.7 - 7.7 K/uL  ? Lymphocytes Relative 19 %  ? Lymphs Abs 1.1 0.7 - 4.0 K/uL  ? Monocytes Relative 1 %  ? Monocytes Absolute 0.1 0.1 - 1.0 K/uL  ? Eosinophils Relative 0 %  ? Eosinophils Absolute 0.0 0.0 - 0.5 K/uL  ? Basophils Relative 0 %  ? Basophils Absolute 0.0 0.0 - 0.1 K/uL  ? Immature Granulocytes 0 %  ? Abs Immature Granulocytes 0.01 0.00 - 0.07 K/uL  ?Troponin I (High Sensitivity)  ?Result Value Ref Range  ?  Troponin I (High Sensitivity) 3 <18 ng/L  ? ?CT Renal Stone Study ? ?Result Date: 04/16/2021 ?CLINICAL DATA:  Flank pain, right upper quadrant abdominal pain EXAM: CT ABDOMEN AND PELVIS WITHOUT CONTRAST TECHNIQUE: Multidetector CT imaging of the abdomen and pelvis was performed following the standard protocol without IV contrast. RADIATION DOSE REDUCTION: This exam was performed according to the departmental dose-optimization program which includes automated exposure control, adjustment of the mA and/or kV according to patient size and/or use of iterative reconstruction technique. COMPARISON:  04/02/2012 FINDINGS: Lower chest: No acute abnormality. Hepatobiliary: No focal liver abnormality is seen. No gallstones, gallbladder wall thickening, or biliary dilatation. Pancreas: Unremarkable Spleen: Unremarkable Adrenals/Urinary Tract: Adrenal glands are unremarkable. Kidneys are normal, without renal calculi, focal lesion, or hydronephrosis. Bladder is unremarkable. Stomach/Bowel: Severe descending and sigmoid colonic diverticulosis without superimposed acute inflammatory change. Stomach, small bowel, and large bowel are otherwise unremarkable. Appendix normal. No free intraperitoneal gas or fluid. Vascular/Lymphatic: Aortic atherosclerosis. No enlarged abdominal or pelvic lymph nodes. Reproductive: Prostate is unremarkable. Other: Left inguinal cord lipoma.  No abdominal wall hernia. Musculoskeletal: No acute bone abnormality IMPRESSION: No acute intra-abdominal pathology identified. No definite radiographic explanation for the patient's reported symptoms. No nephro or urolithiasis. Distal colonic diverticulosis without superimposed acute inflammatory change. Aortic Atherosclerosis (ICD10-I70.0). Electronically Signed   By: Helyn Numbers M.D.   On: 04/16/2021 00:53   ? ?EKG ?EKG Interpretation ? ?Date/Time:  Thursday April 15 2021 23:32:57 EDT ?Ventricular Rate:  62 ?PR Interval:  122 ?QRS Duration: 100 ?QT  Interval:  418 ?QTC Calculation: 424 ?R Axis:   80 ?Text Interpretation: Normal sinus rhythm Confirmed by Nicanor Alcon, Atha Muradyan ([id number]) on 04/16/2021 2:47:42 AM ? ?Radiology ?CT Renal Stone Study ? ?Result Date: 04/16/2021 ?CLINICAL

## 2021-10-30 ENCOUNTER — Other Ambulatory Visit: Payer: Self-pay | Admitting: Cardiology

## 2021-10-30 DIAGNOSIS — I2510 Atherosclerotic heart disease of native coronary artery without angina pectoris: Secondary | ICD-10-CM

## 2021-10-30 DIAGNOSIS — Z8249 Family history of ischemic heart disease and other diseases of the circulatory system: Secondary | ICD-10-CM

## 2021-12-29 ENCOUNTER — Ambulatory Visit: Payer: BC Managed Care – PPO | Admitting: Internal Medicine

## 2021-12-29 ENCOUNTER — Encounter: Payer: Self-pay | Admitting: Internal Medicine

## 2021-12-29 VITALS — BP 120/75 | HR 65 | Ht 72.0 in | Wt 187.2 lb

## 2021-12-29 DIAGNOSIS — I10 Essential (primary) hypertension: Secondary | ICD-10-CM

## 2021-12-29 DIAGNOSIS — K219 Gastro-esophageal reflux disease without esophagitis: Secondary | ICD-10-CM

## 2021-12-29 DIAGNOSIS — I2510 Atherosclerotic heart disease of native coronary artery without angina pectoris: Secondary | ICD-10-CM

## 2021-12-29 NOTE — Progress Notes (Signed)
Date:  01/05/2022   ID:  Kurt Walker, DOB [birth year], MRN [id number]  PCP:  Lawerance Cruel, MD  Cardiologist:  Floydene Flock, DO Former Cardiology Providers: Dr. Wynonia Lawman   Date: 01/05/22 Last Office Visit: 08/23/2019  Chief Complaint  Patient presents with   Coronary Artery Disease   Hypertension   Follow-up    HPI  Kurt Walker is a 56 y.o. male who presents to the office with a chief complaint of " review test results." Patient's past medical history and cardiovascular risk factors include: Hypertension, Family history of coronary artery disease, hyperlipidemia, coronary artery calcification.  Patient is here for follow-up visit. He complains of feeling really light-headed after eating. He also admits to a burning pain sensation in his epigastrium and says its like something is pushing up through his diaphragm. Patient has never been told he has a hernia but he has never been evaluated by GI either. He feels like he is going to pass out after every meal. Otherwise, he does not experience this with activity. Patient denies chest pain, shortness of breath, palpitations, diaphoresis, syncope, edema, PND, orthopnea.   FUNCTIONAL STATUS: Woodworking is his hobby and taking care of kids. But no structured exercise program or daily routine.    ALLERGIES: No Known Allergies   MEDICATION LIST PRIOR TO VISIT: Current Meds  Medication Sig   acetaminophen (TYLENOL) 500 MG tablet Take 1,000 mg by mouth every 6 (six) hours as needed.   ADVAIR DISKUS 250-50 MCG/DOSE AEPB USE ONE INHALATION DAILY (DISCARD 30 DAYS AFTER OPENING) RINSE MOUTH   ASPIRIN LOW DOSE 81 MG EC tablet TAKE 1 TABLET (81 MG TOTAL) BY MOUTH DAILY. SWALLOW WHOLE.   ezetimibe (ZETIA) 10 MG tablet Take 10 mg by mouth daily.   famotidine (PEPCID) 20 MG tablet Take 1 tablet (20 mg total) by mouth 2 (two) times daily.   mometasone (NASONEX) 50 MCG/ACT nasal spray Place 1 spray into the nose daily.   Multiple Vitamin  (MULTIVITAMIN WITH MINERALS) TABS tablet Take 1 tablet by mouth daily.   ramipril (ALTACE) 5 MG capsule Take 5 mg by mouth daily.   rosuvastatin (CRESTOR) 20 MG tablet Take 1 tablet (20 mg total) by mouth daily.   valACYclovir (VALTREX) 500 MG tablet Take 500 mg by mouth daily as needed.     PAST MEDICAL HISTORY: Past Medical History:  Diagnosis Date   Allergic rhinitis    Asthma    Coronary artery calcification of native artery    GERD (gastroesophageal reflux disease)    High cholesterol    Hypertension    Kidney stones     PAST SURGICAL HISTORY: Past Surgical History:  Procedure Laterality Date   HARDWARE REMOVAL Left 06/18/2015   Procedure: LEFT THIRD METATARSAL REMOVAL OF DEEP IMPLANT;  Surgeon: Wylene Simmer, MD;  Location: [place];  Service: Orthopedics;  Laterality: Left;   METATARSAL OSTEOTOMY Left 01/01/2015   Procedure: LEFT THIRD METATARSAL HEAD OSTEOTOMY ;  Surgeon: Wylene Simmer, MD;  Location: [place];  Service: Orthopedics;  Laterality: Left;   NOSE SURGERY     reset fx and cosmetic   TENDON TRANSFER Left 06/18/2015   Procedure: LEFT FLEXOR TO EXTENSOR TRANSFER;  Surgeon: Wylene Simmer, MD;  Location: [facility] [place];  Service: Orthopedics;  Laterality: Left;    FAMILY HISTORY: The patient family history includes Healthy in his father; Heart attack in his mother.  SOCIAL HISTORY:  The patient  reports that he has  never smoked. He has never used smokeless tobacco. He reports current alcohol use. He reports that he does not use drugs.  REVIEW OF SYSTEMS: Review of Systems  Constitutional: Negative for chills and fever.  HENT:  Negative for hoarse voice and nosebleeds.   Eyes:  Negative for discharge, double vision and pain.  Cardiovascular:  Negative for chest pain, claudication, dyspnea on exertion, leg swelling, near-syncope, orthopnea, palpitations, paroxysmal nocturnal dyspnea and syncope.  Respiratory:  Negative for  hemoptysis and shortness of breath.   Musculoskeletal:  Negative for muscle cramps and myalgias.  Gastrointestinal:  Negative for abdominal pain, constipation, diarrhea, hematemesis, hematochezia, melena, nausea and vomiting.  Neurological:  Negative for dizziness and light-headedness.    PHYSICAL EXAM:    12/29/2021    2:21 PM 04/16/2021    3:00 AM 04/15/2021   11:33 PM  Vitals with BMI  Height _0   _1   Weight 187 lbs 3 oz  174 lbs  BMI 65.46  50.35  Systolic 465 681   Diastolic 75 93   Pulse 65 57    CONSTITUTIONAL: Well-developed and well-nourished. No acute distress.  SKIN: Skin is warm and dry. No rash noted. No cyanosis. No pallor. No jaundice HEAD: Normocephalic and atraumatic.  EYES: No scleral icterus MOUTH/THROAT: Moist oral membranes.  NECK: No JVD present. No thyromegaly noted. No carotid bruits  LYMPHATIC: No visible cervical adenopathy.  CHEST Normal respiratory effort. No intercostal retractions.  LUNGS: Clear to auscultation bilaterally.  No stridor. No wheezes. No rales.  CARDIOVASCULAR: Regular rate and rhythm, positive S1-S2, no murmurs rubs or gallops appreciated. ABDOMINAL: No apparent ascites.  EXTREMITIES: No peripheral edema  HEMATOLOGIC: No significant bruising NEUROLOGIC: Oriented to person, place, and time. Nonfocal. Normal muscle tone.  PSYCHIATRIC: Normal mood and affect. Normal behavior. Cooperative  CARDIAC DATABASE: EKG: 08/23/2019: Normal sinus rhythm, 61 bpm, normal axis, without underlying ischemia or injury pattern. 09/17/2020: Sinus bradycardia rate of 57 bpm.  Normal axis.  No evidence of ischemia or underlying injury pattern.  Echocardiogram: 09/06/2019:  Left ventricle cavity is normal in size. Mild concentric hypertrophy of  the left ventricle. Normal global wall motion. Normal LV systolic function  with EF 55%. Normal diastolic filling pattern.  Mild (Grade I) mitral regurgitation.  Mild tricuspid regurgitation.  No evidence of  pulmonary hypertension.  Stress Testing: Exercise Sestamibi stress test 09/02/2019: Exercise nuclear stress test was performed using Bruce protocol.  Patient exercised for 9 minutes 59 seconds, reached 11.7 METS, and 104% of APMHR.  Exercise capacity was excellent. Chest pain not reported. Heart rate and hemodynamic response were normal. Peak EKG demonstrated sinus tachycardia, 1.5-2 mm horizontal/upsloping ST depressions in leads II, III, aVF, V4-V. These changes are equivocal for ischemia, and resolved within 1 min in recovery.  Normal myocardial perfusion. Stress LVEF 67%. Low risk study.  Heart Catheterization: None  LABORATORY DATA: External Labs: 10/30/2020:  Total cholesterol 138, HDL 69, TG 56, LDL 57   Collected: 07/29/2019 Creatinine 0.9 mg/dL. eGFR: 88 mL/min per 1.73 m Lipid profile: Total cholesterol 180, triglycerides 80, HDL 65, LDL 100, non-HDL 115  IMPRESSION:    ICD-10-CM   1. 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 Gastroesophageal reflux disease, unspecified whether esophagitis present  K21.9 Ambulatory referral to Gastroenterology    3. Essential hypertension  I10        RECOMMENDATIONS: KELE BARTHELEMY is a 56 y.o. male whose  past medical history and cardiac risk factors include: Hypertension, Family history of coronary artery disease, hyperlipidemia, coronary artery calcification.  Coronary atherosclerosis due to calcified coronary lesions in the native arteries: External coronary calcium score with a total of 132 predominantly in LAD distribution No recurrence of chest pain Continue Zetia and rosuvastatin Continue aspirin   Benign essential hypertension:  Now well controlled with dietary changes Continue ramipril 5 mg p.o. daily   Mixed hyperlipidemia Continue Zetia 10 mg daily. Continue rosuvastatin 20 mg daily LDL goal less than 70. Lipids well controlled.    Post  prandial hypotension  Will refer to GI  Follow-up in 12 months or sooner if needed  FINAL MEDICATION LIST END OF ENCOUNTER: No orders of the defined types were placed in this encounter.    Current Outpatient Medications:    acetaminophen (TYLENOL) 500 MG tablet, Take 1,000 mg by mouth every 6 (six) hours as needed., Disp: , Rfl:    ADVAIR DISKUS 250-50 MCG/DOSE AEPB, USE ONE INHALATION DAILY (DISCARD 30 DAYS AFTER OPENING) RINSE MOUTH, Disp: 3 each, Rfl: 0   ASPIRIN LOW DOSE 81 MG EC tablet, TAKE 1 TABLET (81 MG TOTAL) BY MOUTH DAILY. SWALLOW WHOLE., Disp: 90 tablet, Rfl: 3   ezetimibe (ZETIA) 10 MG tablet, Take 10 mg by mouth daily., Disp: , Rfl:    famotidine (PEPCID) 20 MG tablet, Take 1 tablet (20 mg total) by mouth 2 (two) times daily., Disp: 14 tablet, Rfl: 0   mometasone (NASONEX) 50 MCG/ACT nasal spray, Place 1 spray into the nose daily., Disp: , Rfl:    Multiple Vitamin (MULTIVITAMIN WITH MINERALS) TABS tablet, Take 1 tablet by mouth daily., Disp: , Rfl:    ramipril (ALTACE) 5 MG capsule, Take 5 mg by mouth daily., Disp: , Rfl:    rosuvastatin (CRESTOR) 20 MG tablet, Take 1 tablet (20 mg total) by mouth daily., Disp: 90 tablet, Rfl: 3   valACYclovir (VALTREX) 500 MG tablet, Take 500 mg by mouth daily as needed., Disp: , Rfl:   Orders Placed This Encounter  Procedures   Ambulatory referral to Gastroenterology   EKG 12-Lead       Floydene Flock, DO, [facility] 01/05/2022, 2:02 PM Office: [phone]

## 2022-01-04 ENCOUNTER — Encounter: Payer: Self-pay | Admitting: Physician Assistant

## 2022-01-31 ENCOUNTER — Ambulatory Visit: Payer: BC Managed Care – PPO | Admitting: Physician Assistant

## 2022-01-31 ENCOUNTER — Other Ambulatory Visit (INDEPENDENT_AMBULATORY_CARE_PROVIDER_SITE_OTHER): Payer: BC Managed Care – PPO

## 2022-01-31 ENCOUNTER — Encounter: Payer: Self-pay | Admitting: Physician Assistant

## 2022-01-31 VITALS — BP 132/70 | HR 58 | Ht 72.0 in | Wt 190.1 lb

## 2022-01-31 DIAGNOSIS — R1310 Dysphagia, unspecified: Secondary | ICD-10-CM

## 2022-01-31 DIAGNOSIS — K219 Gastro-esophageal reflux disease without esophagitis: Secondary | ICD-10-CM

## 2022-01-31 DIAGNOSIS — R5383 Other fatigue: Secondary | ICD-10-CM

## 2022-01-31 LAB — CBC WITH DIFFERENTIAL/PLATELET
Basophils Absolute: 0.1 10*3/uL (ref 0.0–0.1)
Basophils Relative: 1.4 % (ref 0.0–3.0)
Eosinophils Absolute: 0.3 10*3/uL (ref 0.0–0.7)
Eosinophils Relative: 4.4 % (ref 0.0–5.0)
HCT: 44.3 % (ref 39.0–52.0)
Hemoglobin: 15 g/dL (ref 13.0–17.0)
Lymphocytes Relative: 31 % (ref 12.0–46.0)
Lymphs Abs: 1.9 10*3/uL (ref 0.7–4.0)
MCHC: 33.8 g/dL (ref 30.0–36.0)
MCV: 92.9 fl (ref 78.0–100.0)
Monocytes Absolute: 0.4 10*3/uL (ref 0.1–1.0)
Monocytes Relative: 6.6 % (ref 3.0–12.0)
Neutro Abs: 3.4 10*3/uL (ref 1.4–7.7)
Neutrophils Relative %: 56.6 % (ref 43.0–77.0)
Platelets: 200 10*3/uL (ref 150.0–400.0)
RBC: 4.77 Mil/uL (ref 4.22–5.81)
RDW: 13 % (ref 11.5–15.5)
WBC: 6 10*3/uL (ref 4.0–10.5)

## 2022-01-31 LAB — COMPREHENSIVE METABOLIC PANEL
ALT: 19 U/L (ref 0–53)
AST: 20 U/L (ref 0–37)
Albumin: 4.8 g/dL (ref 3.5–5.2)
Alkaline Phosphatase: 44 U/L (ref 39–117)
BUN: 12 mg/dL (ref 6–23)
CO2: 27 mEq/L (ref 19–32)
Calcium: 9.5 mg/dL (ref 8.4–10.5)
Chloride: 104 mEq/L (ref 96–112)
Creatinine, Ser: 0.92 mg/dL (ref 0.40–1.50)
GFR: 93.95 mL/min (ref >60.00)
Glucose, Bld: 97 mg/dL (ref 70–99)
Potassium: 4 mEq/L (ref 3.5–5.1)
Sodium: 140 mEq/L (ref 135–145)
Total Bilirubin: 1 mg/dL (ref 0.2–1.2)
Total Protein: 7.2 g/dL (ref 6.0–8.3)

## 2022-01-31 LAB — B12 AND FOLATE PANEL
Folate: >23.8 ng/mL (ref >5.9)
Vitamin B-12: 270 pg/mL (ref 211–911)

## 2022-01-31 LAB — IBC + FERRITIN
Ferritin: 156.6 ng/mL (ref 22.0–322.0)
Iron: 114 ug/dL (ref 42–165)
Saturation Ratios: 33 % (ref 20.0–50.0)
TIBC: 345.8 ug/dL (ref 250.0–450.0)
Transferrin: 247 mg/dL (ref 212.0–360.0)

## 2022-01-31 LAB — LIPASE: Lipase: 18 U/L (ref 11.0–59.0)

## 2022-01-31 NOTE — Patient Instructions (Signed)
Your provider has requested that you go to the basement level for lab work before leaving today. Press "B" on the elevator. The lab is located at the first door on the left as you exit the elevator.  You have been scheduled for an endoscopy. Please follow written instructions given to you at your visit today. If you use inhalers (even only as needed), please bring them with you on the day of your procedure.  _______________________________________________________  If your blood pressure at your visit was 140/90 or greater, please contact your primary care physician to follow up on this.  _______________________________________________________  If you are age 80 or older, your body mass index should be between 23-30. Your Body mass index is 25.79 kg/m. If this is out of the aforementioned range listed, please consider follow up with your Primary Care Provider.  If you are age 35 or younger, your body mass index should be between 19-25. Your Body mass index is 25.79 kg/m. If this is out of the aformentioned range listed, please consider follow up with your Primary Care Provider.   ________________________________________________________  The [facility] providers would like to encourage you to use [facility] to communicate with providers for non-urgent requests or questions.  Due to long hold times on the telephone, sending your provider a message by [facility] may be a faster and more efficient way to get a response.  Please allow 48 business hours for a response.  Please remember that this is for non-urgent requests.  _______________________________________________________

## 2022-01-31 NOTE — Progress Notes (Signed)
Chief Complaint: GERD  HPI:    Kurt Walker is a 57 year old male with a past medical history as listed below including GERD, who was referred to me by Lawerance Cruel, MD for a complaint of GERD.      04/16/2021 patient seen in the ER for gastritis.  At that time CMP with a glucose of 133 and otherwise normal, lipase normal, CBC normal.  CT renal stone study with no acute intra-abdominal pathology.  Distal colonic diverticulosis.    12/29/2021 patient followed with cardiology.  At that time continued on Zetia and Rosuvastatin as well as Aspirin for coronary atherosclerosis.  Also continued on Ramipril 5 mg p.o. daily for hypertension.  Apparently discussed some postprandial hypotension and he was referred to [place].    Today, the patient presents to clinic accompanied by his wife.  They start by explaining that he has maybe not been eating as healthy as he normally does and has gained about 10 to 15 pounds.  Tells me that over the month of December he was experiencing some shortness of breath and severe fatigue about anytime that he would eat anything.  Tells me he would take 10-20 steps away from the table and feel like he had to sit down or even get dizzy.  He initially saw his cardiologist about this who thought he possibly had some postprandial hypotension.  Tells me that he also had an episode of vomiting after eating fried food which never happens to him.  He has not vomited in over 20 years.  Also describes some dysphagia noting that it feels like food has a hard time going down over the past 2 years or so off-and-on.  This is definitively worse with "[employer] cold [place] fries".  Over the past 2 to 3 weeks he has started over-the-counter Omeprazole 20 mg once daily and feels like that has helped a lot with the feeling of losing his breath after eating but still remains fatigued and wonders what is going on.    Reports previous colonoscopy about 2 years ago at [employer] [facility].    Denies fever, chills,  weight loss, blood in his stool or symptoms that awaken him from sleep.  Past Medical History:  Diagnosis Date   Allergic rhinitis    Asthma    Coronary artery calcification of native artery    GERD (gastroesophageal reflux disease)    High cholesterol    Hypertension    Kidney stones     Past Surgical History:  Procedure Laterality Date   HARDWARE REMOVAL Left 06/18/2015   Procedure: LEFT THIRD METATARSAL REMOVAL OF DEEP IMPLANT;  Surgeon: Wylene Simmer, MD;  Location: [place];  Service: Orthopedics;  Laterality: Left;   METATARSAL OSTEOTOMY Left 01/01/2015   Procedure: LEFT THIRD METATARSAL HEAD OSTEOTOMY ;  Surgeon: Wylene Simmer, MD;  Location: [place];  Service: Orthopedics;  Laterality: Left;   NOSE SURGERY     reset fx and cosmetic   TENDON TRANSFER Left 06/18/2015   Procedure: LEFT FLEXOR TO EXTENSOR TRANSFER;  Surgeon: Wylene Simmer, MD;  Location: [place];  Service: Orthopedics;  Laterality: Left;    Current Outpatient Medications  Medication Sig Dispense Refill   acetaminophen (TYLENOL) 500 MG tablet Take 1,000 mg by mouth every 6 (six) hours as needed.     ADVAIR DISKUS 250-50 MCG/DOSE AEPB USE ONE INHALATION DAILY (DISCARD 30 DAYS AFTER OPENING) RINSE MOUTH 3 each 0   ASPIRIN LOW DOSE 81 MG  EC tablet TAKE 1 TABLET (81 MG TOTAL) BY MOUTH DAILY. SWALLOW WHOLE. 90 tablet 3   ezetimibe (ZETIA) 10 MG tablet Take 10 mg by mouth daily.     famotidine (PEPCID) 20 MG tablet Take 1 tablet (20 mg total) by mouth 2 (two) times daily. 14 tablet 0   mometasone (NASONEX) 50 MCG/ACT nasal spray Place 1 spray into the nose daily.     Multiple Vitamin (MULTIVITAMIN WITH MINERALS) TABS tablet Take 1 tablet by mouth daily.     ramipril (ALTACE) 5 MG capsule Take 5 mg by mouth daily.     rosuvastatin (CRESTOR) 20 MG tablet Take 1 tablet (20 mg total) by mouth daily. 90 tablet 3   valACYclovir (VALTREX) 500 MG tablet Take 500 mg by mouth daily as  needed.     No current facility-administered medications for this visit.    Allergies as of 01/31/2022   (No Known Allergies)    Family History  Problem Relation Age of Onset   Heart attack Mother    Healthy Father     Social History   Socioeconomic History   Marital status: Married    Spouse name: Not on file   Number of children: 4   Years of education: Not on file   Highest education level: Not on file  Occupational History   Not on file  Tobacco Use   Smoking status: Never   Smokeless tobacco: Never  Vaping Use   Vaping Use: Never used  Substance and Sexual Activity   Alcohol use: Yes    Comment: social   Drug use: No   Sexual activity: Not on file  Other Topics Concern   Not on file  Social History Narrative   Remarried, son and daughter. Self-employed- ice IT trainer.    1 deceased son   Social Determinants of Corporate investment banker Strain: Not on file  Food Insecurity: Not on file  Transportation Needs: Not on file  Physical Activity: Not on file  Stress: Not on file  Social Connections: Not on file  Intimate Partner Violence: Not on file    Review of Systems:    Constitutional: No weight loss, fever or chills Skin: No rash  Cardiovascular: No chest pain  Respiratory: +DOE Gastrointestinal: See HPI and otherwise negative Genitourinary: No dysuria  Neurological: +dizziness Musculoskeletal: No new muscle or joint pain Hematologic: No bleeding  Psychiatric: No history of depression or anxiety   Physical Exam:  Vital signs: BP 132/70   Pulse (!) 58   Ht 6' (1.829 m)   Wt 190 lb 2 oz (86.2 kg)   BMI 25.79 kg/m    Constitutional:   Pleasant Caucasian male appears to be in NAD, Well developed, Well nourished, alert and cooperative Head:  Normocephalic and atraumatic. Eyes:   PEERL, EOMI. No icterus. Conjunctiva pink. Ears:  Normal auditory acuity. Neck:  Supple Throat: Oral cavity and pharynx without  inflammation, swelling or lesion.  Respiratory: Respirations even and unlabored. Lungs clear to auscultation bilaterally.   No wheezes, crackles, or rhonchi.  Cardiovascular: Normal S1, S2. No MRG. Regular rate and rhythm. No peripheral edema, cyanosis or pallor.  Gastrointestinal:  Soft, nondistended, mild epigastric ttp, No rebound or guarding. Normal bowel sounds. No appreciable masses or hepatomegaly. Rectal:  Not performed.  Msk:  Symmetrical without gross deformities. Without edema, no deformity or joint abnormality.  Neurologic:  Alert and  oriented x4;  grossly normal neurologically.  Skin:   Dry  and intact without significant lesions or rashes. Psychiatric: Demonstrates good judgement and reason without abnormal affect or behaviors.  No recent labs or imaging.  Assessment: 1.  GERD: Intermittent symptoms over the past couple of years, has not been on Omeprazole for a while, weight gain likely contributory +/- diet 2.  Postprandial hypotension?  Describes a feeling of severe fatigue and dizziness after eating, this has gotten some better on Omeprazole 3.  Dysphagia: Over the past 2 years with some intermittent reflux symptoms; Consider esophageal stricture versus dysmotility versus other 4.  Fatigue: Question lifestyle being the data 5+/- anemia  Plan: 1.  Scheduled patient for diagnostic EGD with dilation in the [facility] with Dr. Bryan Lemma per patient request.  Did provide the patient a detailed list risks for the procedure and he agrees to proceed. Patient is appropriate for endoscopic procedure(s) in the ambulatory ([place]) setting.  2.  Patient would like to try coming off of his Omeprazole 20 mg as he feels that is giving him ED.  Explained that if he has more symptoms we could try different PPI that may not have the same side effect for him. 3.  Reviewed antireflux diet and lifestyle modifications. 4.  Ordered labs including B12, iron, CBC, CMP and lipase 5.  We will request records  from colonoscopy done at [facility] 2 years ago 6.  Could consider a CT angiogram if symptoms persist pending results from EGD 7.  Patient to follow in clinic per recommendations after above.  Kurt Newer, PA-C [facility] 01/31/2022, 11:26 AM  Cc: Lawerance Cruel, MD

## 2022-02-09 NOTE — Progress Notes (Addendum)
Agree with the assessment and plan as outlined by Ellouise Newer, PA-C.   04/09/2022 addendum: Received a copy of the previous colonoscopy report from Dr. Alessandra Bevels at [place] which was notable for the following: - 12/12/2017: Colonoscopy: 4 mm polyp removed from distal ascending colon and 2 mm polyp removed from transverse colon, both tubular adenomas.  Sigmoid diverticulosis, otherwise normal.  Recommended repeat in 5 years  Based on these results and recommendation from his previous Gastroenterologist, reasonable to schedule for repeat colonoscopy later this year for ongoing surveillance.  Kurt Montini, DO, [facility]

## 2022-02-17 ENCOUNTER — Ambulatory Visit (AMBULATORY_SURGERY_CENTER): Payer: BC Managed Care – PPO | Admitting: Gastroenterology

## 2022-02-17 ENCOUNTER — Encounter: Payer: Self-pay | Admitting: Gastroenterology

## 2022-02-17 VITALS — BP 131/75 | HR 73 | Temp 97.8°F | Resp 11 | Ht 72.0 in | Wt 190.0 lb

## 2022-02-17 DIAGNOSIS — K2970 Gastritis, unspecified, without bleeding: Secondary | ICD-10-CM

## 2022-02-17 DIAGNOSIS — R1310 Dysphagia, unspecified: Secondary | ICD-10-CM | POA: Diagnosis not present

## 2022-02-17 DIAGNOSIS — K219 Gastro-esophageal reflux disease without esophagitis: Secondary | ICD-10-CM | POA: Diagnosis not present

## 2022-02-17 DIAGNOSIS — R1013 Epigastric pain: Secondary | ICD-10-CM

## 2022-02-17 DIAGNOSIS — K2950 Unspecified chronic gastritis without bleeding: Secondary | ICD-10-CM | POA: Diagnosis not present

## 2022-02-17 MED ORDER — SODIUM CHLORIDE 0.9 % IV SOLN: 500.0000 mL | Freq: Once | INTRAVENOUS | Status: DC

## 2022-02-17 NOTE — Progress Notes (Signed)
Pt's states no medical or surgical changes since previsit or office visit. 

## 2022-02-17 NOTE — Progress Notes (Signed)
GASTROENTEROLOGY PROCEDURE H&P NOTE   Primary Care Physician: Lawerance Cruel, MD    Reason for Procedure:   Dysphagia, GERD  Plan:    EGD  Patient is appropriate for endoscopic procedure(s) in the ambulatory ([place]) setting.  The nature of the procedure, as well as the risks, benefits, and alternatives were carefully and thoroughly reviewed with the patient. Ample time for discussion and questions allowed. The patient understood, was satisfied, and agreed to proceed.     HPI: Kurt Walker is a 57 y.o. male who presents for EGD for evaluation of dysphagia, GERD .  Patient was most recently seen in the Gastroenterology Clinic on 01/31/2022.  No interval change in medical history since that appointment. Please refer to that note for full details regarding GI history and clinical presentation.   Past Medical History:  Diagnosis Date   Allergic rhinitis    Asthma    Coronary artery calcification of native artery    GERD (gastroesophageal reflux disease)    High cholesterol    Hypertension    Kidney stones     Past Surgical History:  Procedure Laterality Date   COLONOSCOPY     HARDWARE REMOVAL Left 06/18/2015   Procedure: LEFT THIRD METATARSAL REMOVAL OF DEEP IMPLANT;  Surgeon: Wylene Simmer, MD;  Location: [place];  Service: Orthopedics;  Laterality: Left;   METATARSAL OSTEOTOMY Left 01/01/2015   Procedure: LEFT THIRD METATARSAL HEAD OSTEOTOMY ;  Surgeon: Wylene Simmer, MD;  Location: [place];  Service: Orthopedics;  Laterality: Left;   NOSE SURGERY     reset fx and cosmetic   TENDON TRANSFER Left 06/18/2015   Procedure: LEFT FLEXOR TO EXTENSOR TRANSFER;  Surgeon: Wylene Simmer, MD;  Location: [place];  Service: Orthopedics;  Laterality: Left;   UPPER GASTROINTESTINAL ENDOSCOPY      Prior to Admission medications   Medication Sig Start Date End Date Taking? Authorizing Provider  ADVAIR DISKUS 250-50 MCG/DOSE AEPB USE  ONE INHALATION DAILY (DISCARD 30 DAYS AFTER OPENING) RINSE MOUTH 12/14/12  Yes Young, Clinton D, MD  ezetimibe (ZETIA) 10 MG tablet Take 10 mg by mouth daily. 07/31/20  Yes [provider]  mometasone (NASONEX) 50 MCG/ACT nasal spray Place 1 spray into the nose daily. 12/17/11  Yes [provider]  ramipril (ALTACE) 5 MG capsule Take 5 mg by mouth daily.   Yes [provider]  valACYclovir (VALTREX) 500 MG tablet Take 500 mg by mouth daily as needed. 08/06/19  Yes [provider]  acetaminophen (TYLENOL) 500 MG tablet Take 1,000 mg by mouth every 6 (six) hours as needed.    [provider]  ASPIRIN LOW DOSE 81 MG EC tablet TAKE 1 TABLET (81 MG TOTAL) BY MOUTH DAILY. SWALLOW WHOLE. 10/19/20   Tolia, Sunit, DO  Multiple Vitamin (MULTIVITAMIN WITH MINERALS) TABS tablet Take 1 tablet by mouth daily.    [provider]  rosuvastatin (CRESTOR) 20 MG tablet Take 1 tablet (20 mg total) by mouth daily. 11/11/20   Cantwell, Gerline Legacy, PA-C    Current Outpatient Medications  Medication Sig Dispense Refill   ADVAIR DISKUS 250-50 MCG/DOSE AEPB USE ONE INHALATION DAILY (DISCARD 30 DAYS AFTER OPENING) RINSE MOUTH 3 each 0   ezetimibe (ZETIA) 10 MG tablet Take 10 mg by mouth daily.     mometasone (NASONEX) 50 MCG/ACT nasal spray Place 1 spray into the nose daily.     ramipril (ALTACE) 5 MG capsule Take 5 mg by  mouth daily.     valACYclovir (VALTREX) 500 MG tablet Take 500 mg by mouth daily as needed.     acetaminophen (TYLENOL) 500 MG tablet Take 1,000 mg by mouth every 6 (six) hours as needed.     ASPIRIN LOW DOSE 81 MG EC tablet TAKE 1 TABLET (81 MG TOTAL) BY MOUTH DAILY. SWALLOW WHOLE. 90 tablet 3   Multiple Vitamin (MULTIVITAMIN WITH MINERALS) TABS tablet Take 1 tablet by mouth daily.     rosuvastatin (CRESTOR) 20 MG tablet Take 1 tablet (20 mg total) by mouth daily. 90 tablet 3   Current Facility-Administered Medications  Medication Dose Route Frequency  Provider Last Rate Last Admin   0.9 %  sodium chloride infusion  500 mL Intravenous Once Divonte Senger V, DO        Allergies as of 02/17/2022   (No Known Allergies)    Family History  Problem Relation Age of Onset   Heart attack Mother    Healthy Father    Colon cancer Neg Hx    Esophageal cancer Neg Hx    Rectal cancer Neg Hx    Stomach cancer Neg Hx     Social History   Socioeconomic History   Marital status: Married    Spouse name: Not on file   Number of children: 4   Years of education: Not on file   Highest education level: Not on file  Occupational History   Not on file  Tobacco Use   Smoking status: Never   Smokeless tobacco: Never  Vaping Use   Vaping Use: Never used  Substance and Sexual Activity   Alcohol use: Yes    Comment: rarly   Drug use: No   Sexual activity: Not on file  Other Topics Concern   Not on file  Social History Narrative   Remarried, 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    1 deceased son   Social Determinants of Radio broadcast assistant Strain: Not on file  Food Insecurity: Not on file  Transportation Needs: Not on file  Physical Activity: Not on file  Stress: Not on file  Social Connections: Not on file  Intimate Partner Violence: Not on file    Physical Exam: Vital signs in last 24 hours: @BP  135/75   Pulse 60   Temp 97.8 F (36.6 C) (Temporal)   Ht 6' (1.829 m)   Wt 190 lb (86.2 kg)   SpO2 100%   BMI 25.77 kg/m  GEN: NAD EYE: Sclerae anicteric ENT: MMM CV: Non-tachycardic Pulm: CTA b/l GI: Soft, NT/ND NEURO:  Alert & Oriented x 3   Gerrit Heck, DO [facility]   02/17/2022 10:21 AM

## 2022-02-17 NOTE — Op Note (Signed)
[facility] Patient Name: Kurt Walker Procedure Date: 02/17/2022 10:30 AM MRN: [id number] Endoscopist: Gerrit Heck , MD, [id number] Age: 57 Referring MD:  Date of Birth: 1965/04/26 Gender: Male Account #: [id number] Procedure:                Upper GI endoscopy w/ Savary dilation and biopsies Indications:              Epigastric abdominal pain, Dysphagia, Heartburn,                            Suspected esophageal reflux Medicines:                Monitored Anesthesia Care Procedure:                Pre-Anesthesia Assessment:                           - Prior to the procedure, a History and Physical                            was performed, and patient medications and                            allergies were reviewed. The patient's tolerance of                            previous anesthesia was also reviewed. The risks                            and benefits of the procedure and the sedation                            options and risks were discussed with the patient.                            All questions were answered, and informed consent                            was obtained. Prior Anticoagulants: The patient has                            taken no anticoagulant or antiplatelet agents. ASA                            Grade Assessment: II - A patient with mild systemic                            disease. After reviewing the risks and benefits,                            the patient was deemed in satisfactory condition to                            undergo the procedure.  After obtaining informed consent, the endoscope was                            passed under direct vision. Throughout the                            procedure, the patient's blood pressure, pulse, and                            oxygen saturations were monitored continuously. The                            Olympus Scope [id number] was introduced through the                             mouth, and advanced to the second part of duodenum.                            The upper GI endoscopy was accomplished without                            difficulty. The patient tolerated the procedure                            well. Scope In: Scope Out: Findings:                 The examined esophagus was normal. A guidewire was                            placed and the scope was withdrawn. Dilation was                            performed with a Savary dilator with no resistance                            at 17 mm. The dilation site was examined following                            endoscope reinsertion and showed no bleeding,                            mucosal tear or perforation. Biopsies were then                            obtained from the proximal and distal esophagus                            with cold forceps for histology of suspected                            eosinophilic esophagitis.                           The Z-line  was regular and was found 42 cm from the                            incisors.                           The gastroesophageal flap valve was visualized                            endoscopically and classified as Hill Grade II                            (fold present, opens with respiration).                           Patchy minimal inflammation characterized by                            congestion (edema) and erythema was found in the                            gastric body, in the gastric antrum and in the                            prepyloric region of the stomach. Biopsies were                            taken with a cold forceps for Helicobacter pylori                            testing. Estimated blood loss was minimal.                           The examined duodenum was normal. Complications:            No immediate complications. Estimated Blood Loss:     Estimated blood loss was minimal. Impression:               - Normal esophagus. Dilated  with 17 mm Savary                            dilator. Biopsies were taken with a cold forceps                            for evaluation of eosinophilic esophagitis.                           - Z-line regular, 42 cm from the incisors.                           - Gastroesophageal flap valve classified as Hill                            Grade II (fold present, opens with respiration).                           -  Mild, patchy, non-ulcer gastritis. Biopsied.                           - Normal examined duodenum. Recommendation:           - Patient has a contact number available for                            emergencies. The signs and symptoms of potential                            delayed complications were discussed with the                            patient. Return to normal activities tomorrow.                            Written discharge instructions were provided to the                            patient.                           - Resume previous diet.                           - Continue present medications.                           - Await pathology results.                           - Return to GI clinic PRN.                           - Restart Prilosec 20 mg daily for diagnostic and                            therapeutic intent. Doristine Locks, MD 02/17/2022 10:56:23 AM

## 2022-02-17 NOTE — Progress Notes (Signed)
Called to room to assist during endoscopic procedure.  Patient ID and intended procedure confirmed with present staff. Received instructions for my participation in the procedure from the performing physician.  

## 2022-02-17 NOTE — Progress Notes (Signed)
Pt resting comfortably. VSS. Airway intact. SBAR complete to RN. All questions answered.   

## 2022-02-17 NOTE — Patient Instructions (Signed)
Please read handouts provided. Continue present medications. Await pathology results. Return to GI clinic as needed. Restart Prilosec 20 mg daily.   YOU HAD AN ENDOSCOPIC PROCEDURE TODAY AT [facility]:   Refer to the procedure report that was given to you for any specific questions about what was found during the examination.  If the procedure report does not answer your questions, please call your gastroenterologist to clarify.  If you requested that your care partner not be given the details of your procedure findings, then the procedure report has been included in a sealed envelope for you to review at your convenience later.  YOU SHOULD EXPECT: Some feelings of bloating in the abdomen. Passage of more gas than usual.  Walking can help get rid of the air that was put into your GI tract during the procedure and reduce the bloating. If you had a lower endoscopy (such as a colonoscopy or flexible sigmoidoscopy) you may notice spotting of blood in your stool or on the toilet paper. If you underwent a bowel prep for your procedure, you may not have a normal bowel movement for a few days.  Please Note:  You might notice some irritation and congestion in your nose or some drainage.  This is from the oxygen used during your procedure.  There is no need for concern and it should clear up in a day or so.  SYMPTOMS TO REPORT IMMEDIATELY:   Following upper endoscopy (EGD)  Vomiting of blood or coffee ground material  New chest pain or pain under the shoulder blades  Painful or persistently difficult swallowing  New shortness of breath  Fever of 100F or higher  Black, tarry-looking stools  For urgent or emergent issues, a gastroenterologist can be reached at any hour by calling [phone]. Do not use MyChart messaging for urgent concerns.    DIET:  We do recommend a small meal at first, but then you may proceed to your regular diet.  Drink plenty of fluids but you should  avoid alcoholic beverages for 24 hours.  ACTIVITY:  You should plan to take it easy for the rest of today and you should NOT DRIVE or use heavy machinery until tomorrow (because of the sedation medicines used during the test).    FOLLOW UP: Our staff will call the number listed on your records the next business day following your procedure.  We will call around 7:15- 8:00 am to check on you and address any questions or concerns that you may have regarding the information given to you following your procedure. If we do not reach you, we will leave a message.     If any biopsies were taken you will be contacted by phone or by letter within the next 1-3 weeks.  Please call us at [phone] if you have not heard about the biopsies in 3 weeks.    SIGNATURES/CONFIDENTIALITY: You and/or your care partner have signed paperwork which will be entered into your electronic medical record.  These signatures attest to the fact that that the information above on your After Visit Summary has been reviewed and is understood.  Full responsibility of the confidentiality of this discharge information lies with you and/or your care-partner.

## 2022-02-18 ENCOUNTER — Telehealth: Payer: Self-pay

## 2022-02-18 NOTE — Telephone Encounter (Signed)
Left message on follow up call. 

## 2022-02-24 ENCOUNTER — Encounter: Payer: Self-pay | Admitting: Gastroenterology

## 2022-03-31 ENCOUNTER — Other Ambulatory Visit: Payer: Self-pay

## 2022-03-31 MED ORDER — ROSUVASTATIN CALCIUM 20 MG PO TABS: 20.0000 mg | ORAL_TABLET | Freq: Every day | ORAL | 3 refills | Status: AC

## 2022-08-23 ENCOUNTER — Encounter: Payer: Self-pay | Admitting: Internal Medicine

## 2022-08-23 ENCOUNTER — Emergency Department (HOSPITAL_BASED_OUTPATIENT_CLINIC_OR_DEPARTMENT_OTHER): Payer: BC Managed Care – PPO

## 2022-08-23 ENCOUNTER — Observation Stay (HOSPITAL_BASED_OUTPATIENT_CLINIC_OR_DEPARTMENT_OTHER)
Admission: EM | Admit: 2022-08-23 | Discharge: 2022-08-25 | Disposition: A | Discharge status: Home / Self Care | Payer: BC Managed Care – PPO | Attending: Family Medicine | Admitting: Family Medicine

## 2022-08-23 ENCOUNTER — Other Ambulatory Visit: Payer: Self-pay

## 2022-08-23 ENCOUNTER — Other Ambulatory Visit (HOSPITAL_BASED_OUTPATIENT_CLINIC_OR_DEPARTMENT_OTHER): Payer: Self-pay

## 2022-08-23 DIAGNOSIS — I2089 Other forms of angina pectoris: Secondary | ICD-10-CM

## 2022-08-23 DIAGNOSIS — I10 Essential (primary) hypertension: Secondary | ICD-10-CM | POA: Diagnosis not present

## 2022-08-23 DIAGNOSIS — Z7982 Long term (current) use of aspirin: Secondary | ICD-10-CM | POA: Insufficient documentation

## 2022-08-23 DIAGNOSIS — R0609 Other forms of dyspnea: Secondary | ICD-10-CM | POA: Diagnosis not present

## 2022-08-23 DIAGNOSIS — Z79899 Other long term (current) drug therapy: Secondary | ICD-10-CM | POA: Insufficient documentation

## 2022-08-23 DIAGNOSIS — E785 Hyperlipidemia, unspecified: Secondary | ICD-10-CM

## 2022-08-23 DIAGNOSIS — K219 Gastro-esophageal reflux disease without esophagitis: Secondary | ICD-10-CM | POA: Diagnosis not present

## 2022-08-23 DIAGNOSIS — R55 Syncope and collapse: Secondary | ICD-10-CM | POA: Diagnosis not present

## 2022-08-23 DIAGNOSIS — J45909 Unspecified asthma, uncomplicated: Secondary | ICD-10-CM | POA: Diagnosis not present

## 2022-08-23 DIAGNOSIS — R0602 Shortness of breath: Secondary | ICD-10-CM | POA: Insufficient documentation

## 2022-08-23 DIAGNOSIS — I2510 Atherosclerotic heart disease of native coronary artery without angina pectoris: Secondary | ICD-10-CM

## 2022-08-23 DIAGNOSIS — Z8249 Family history of ischemic heart disease and other diseases of the circulatory system: Secondary | ICD-10-CM

## 2022-08-23 LAB — CBC
HCT: 40 % (ref 39.0–52.0)
Hemoglobin: 13.7 g/dL (ref 13.0–17.0)
MCH: 31.6 pg (ref 26.0–34.0)
MCHC: 34.3 g/dL (ref 30.0–36.0)
MCV: 92.2 fL (ref 80.0–100.0)
Platelets: 194 10*3/uL (ref 150–400)
RBC: 4.34 MIL/uL (ref 4.22–5.81)
RDW: 12.8 % (ref 11.5–15.5)
WBC: 5.2 10*3/uL (ref 4.0–10.5)
nRBC: 0 % (ref 0.0–0.2)

## 2022-08-23 LAB — BASIC METABOLIC PANEL
Anion gap: 9 (ref 5–15)
BUN: 9 mg/dL (ref 6–20)
CO2: 23 mmol/L (ref 22–32)
Calcium: 8.8 mg/dL — ABNORMAL LOW (ref 8.9–10.3)
Chloride: 107 mmol/L (ref 98–111)
Creatinine, Ser: 0.8 mg/dL (ref 0.61–1.24)
GFR, Estimated: >60 mL/min (ref >60)
Glucose, Bld: 90 mg/dL (ref 70–99)
Potassium: 3.8 mmol/L (ref 3.5–5.1)
Sodium: 139 mmol/L (ref 135–145)

## 2022-08-23 LAB — TROPONIN I (HIGH SENSITIVITY)
Troponin I (High Sensitivity): 56 ng/L — ABNORMAL HIGH (ref <18)
Troponin I (High Sensitivity): 64 ng/L — ABNORMAL HIGH (ref <18)
Troponin I (High Sensitivity): 72 ng/L — ABNORMAL HIGH (ref <18)

## 2022-08-23 LAB — BRAIN NATRIURETIC PEPTIDE: B Natriuretic Peptide: 25.5 pg/mL (ref 0.0–100.0)

## 2022-08-23 LAB — HIV ANTIBODY (ROUTINE TESTING W REFLEX): HIV Screen 4th Generation wRfx: NONREACTIVE

## 2022-08-23 MED ORDER — POLYETHYLENE GLYCOL 3350 17 G PO PACK: 17.0000 g | PACK | Freq: Every day | ORAL | Status: DC | PRN

## 2022-08-23 MED ORDER — SODIUM CHLORIDE 0.9% FLUSH
3.0000 mL | Freq: Two times a day (BID) | INTRAVENOUS | Status: DC
  Administered 2022-08-23 – 2022-08-25 (×4): 3 mL via INTRAVENOUS

## 2022-08-23 MED ORDER — MOMETASONE FURO-FORMOTEROL FUM 200-5 MCG/ACT IN AERO
2.0000 | INHALATION_SPRAY | Freq: Two times a day (BID) | RESPIRATORY_TRACT | Status: DC
  Administered 2022-08-23 – 2022-08-25 (×4): 2 via RESPIRATORY_TRACT
  Filled 2022-08-23: qty 8.8

## 2022-08-23 MED ORDER — ACETAMINOPHEN 650 MG RE SUPP: 650.0000 mg | Freq: Four times a day (QID) | RECTAL | Status: DC | PRN

## 2022-08-23 MED ORDER — ACETAMINOPHEN 325 MG PO TABS: 650.0000 mg | ORAL_TABLET | Freq: Four times a day (QID) | ORAL | Status: DC | PRN

## 2022-08-23 MED ORDER — EZETIMIBE 10 MG PO TABS
10.0000 mg | ORAL_TABLET | Freq: Every day | ORAL | Status: DC
  Administered 2022-08-24 – 2022-08-25 (×2): 10 mg via ORAL
  Filled 2022-08-23 (×2): qty 1

## 2022-08-23 MED ORDER — ROSUVASTATIN CALCIUM 20 MG PO TABS
20.0000 mg | ORAL_TABLET | Freq: Every day | ORAL | Status: DC
  Administered 2022-08-24 – 2022-08-25 (×2): 20 mg via ORAL
  Filled 2022-08-23 (×2): qty 1

## 2022-08-23 MED ORDER — RAMIPRIL 5 MG PO CAPS
5.0000 mg | ORAL_CAPSULE | Freq: Every day | ORAL | Status: DC
  Filled 2022-08-23: qty 1

## 2022-08-23 MED ORDER — PANTOPRAZOLE SODIUM 40 MG PO TBEC
40.0000 mg | DELAYED_RELEASE_TABLET | Freq: Every day | ORAL | Status: DC
  Administered 2022-08-24 – 2022-08-25 (×2): 40 mg via ORAL
  Filled 2022-08-23 (×2): qty 1

## 2022-08-23 MED ORDER — ASPIRIN 325 MG PO TABS
325.0000 mg | ORAL_TABLET | Freq: Once | ORAL | Status: AC
  Administered 2022-08-23: 325 mg via ORAL
  Filled 2022-08-23: qty 1

## 2022-08-23 MED ORDER — ENOXAPARIN SODIUM 40 MG/0.4ML IJ SOSY: 40.0000 mg | PREFILLED_SYRINGE | INTRAMUSCULAR | Status: DC

## 2022-08-23 MED ORDER — LORAZEPAM 1 MG PO TABS
1.0000 mg | ORAL_TABLET | Freq: Once | ORAL | Status: AC
  Administered 2022-08-23: 1 mg via ORAL
  Filled 2022-08-23: qty 1

## 2022-08-23 NOTE — ED Notes (Signed)
Pt endorses claustrophobia, edp  notified to see if medication can be ordered for [facility] transport.

## 2022-08-23 NOTE — ED Notes (Signed)
[facility] at [facility] will send transport for Bed Ready at [facility] 6E RM# [id number].-ABB(NS)

## 2022-08-23 NOTE — ED Triage Notes (Signed)
Pt presents to the er after passing out at home walking up a flight of stairs. Pt also reports sob and neck pain. Pt was recently diagnosed with a large heart murmur. Pt also reports symptoms repeated over a few months

## 2022-08-23 NOTE — ED Provider Notes (Signed)
[place] EMERGENCY DEPARTMENT AT [facility] Provider Note   CSN: [id number] Arrival date & time: 08/23/22  1229     History  No chief complaint on file.   Kurt PENDELTON is a 57 y.o. male.  This is a 57 year old male here today after a syncopal event.  Patient has a past medical history significant for hypertension, coronary artery disease, hyperlipidemia.  Patient says that over the last 2 weeks, he has noticed he becomes increasingly short of breath with exertion.  Today, while climbing up the stairs, he began to feel very faint, lightheaded, briefly lost consciousness.  He did not strike his head.  Patient says that he was seen by his primary care doctor 2 weeks ago, who reported that he had an increase in his heart murmur.  He was scheduled to follow-up with cardiology on the 20th.        Home Medications Prior to Admission medications   Medication Sig Start Date End Date Taking? Authorizing Provider  acetaminophen (TYLENOL) 500 MG tablet Take 1,000 mg by mouth every 6 (six) hours as needed.    [provider]  ADVAIR DISKUS 250-50 MCG/DOSE AEPB USE ONE INHALATION DAILY (DISCARD 30 DAYS AFTER OPENING) RINSE MOUTH 12/14/12   Young, Clinton D, MD  ASPIRIN LOW DOSE 81 MG EC tablet TAKE 1 TABLET (81 MG TOTAL) BY MOUTH DAILY. SWALLOW WHOLE. 10/19/20   Tolia, Sunit, DO  ezetimibe (ZETIA) 10 MG tablet Take 10 mg by mouth daily. 07/31/20   [provider]  mometasone (NASONEX) 50 MCG/ACT nasal spray Place 1 spray into the nose daily. 12/17/11   [provider]  Multiple Vitamin (MULTIVITAMIN WITH MINERALS) TABS tablet Take 1 tablet by mouth daily.    [provider]  ramipril (ALTACE) 5 MG capsule Take 5 mg by mouth daily.    [provider]  rosuvastatin (CRESTOR) 20 MG tablet Take 1 tablet (20 mg total) by mouth daily. 03/31/22   Custovic, Rozell Searing, DO  valACYclovir (VALTREX) 500 MG tablet Take 500 mg by mouth daily as needed.  08/06/19   [provider]      Allergies    Patient has no known allergies.    Review of Systems   Review of Systems  Physical Exam Updated Vital Signs BP 135/86   Pulse (!) 59   Temp 98 F (36.7 C) (Oral)   Resp 13   SpO2 100%  Physical Exam HENT:     Head: Normocephalic.  Eyes:     Pupils: Pupils are equal, round, and reactive to light.  Cardiovascular:     Rate and Rhythm: Normal rate and regular rhythm.     Pulses: Normal pulses.     Heart sounds: Murmur heard.     Systolic murmur is present with a grade of 2/6.     No friction rub.  Neurological:     General: No focal deficit present.     Mental Status: He is alert.     ED Results / Procedures / Treatments   Labs (all labs ordered are listed, but only abnormal results are displayed) Labs Reviewed  BASIC METABOLIC PANEL  CBC  BRAIN NATRIURETIC PEPTIDE  TROPONIN I (HIGH SENSITIVITY)    EKG None  Radiology No results found.  Procedures Procedures    Medications Ordered in ED Medications - No data to display  ED Course/ Medical Decision Making/ A&P Clinical Course as of 08/23/22 1355  Tue Aug 23, 2022  1353 Troponin I (High  Sensitivity)(!): 56 [JS]    Clinical Course User Index [JS] Arletha Pili, DO                                 Medical Decision Making 57 year old male here today for syncopal episode, dyspnea on exertion.  Differential diagnoses include stable angina, ACS, structural heart disease, less likely pericardial effusion, less likely PE.  Plan- patient's story is concerning for high risk syncope, likely cardiac in nature.  I do not appreciate a significant murmur on this patient, however patient has been regularly followed by his PCP and it sounds as though this 1 is more significant than previously.  Did obtain a bedside ultrasound which did not show any pericardial effusion.  I have ordered an EKG on the patient.  Patient with significant cardiac risk factors,  including significant family history of coronary artery disease.  He also has hypertension, hyperlipidemia.  Anticipate admission for the patient.  Reassessment-patient does have an elevated troponin to 56, last troponin was in 2023 and was not elevated.  Patient not currently experiencing any chest pain.  I have ordered aspirin.  Will admit patient to hospitalist.  Amount and/or Complexity of Data Reviewed Labs: ordered. Decision-making details documented in ED Course. Radiology: ordered.          Final Clinical Impression(s) / ED Diagnoses Final diagnoses:  None    Rx / DC Orders ED Discharge Orders     None         Arletha Pili, DO 08/23/22 1358

## 2022-08-23 NOTE — H&P (Signed)
History and Physical   Kurt Walker:[id number] DOB: May 29, 1965 DOA: 08/23/2022  PCP: Daisy Floro, MD   Patient coming from: Home  Chief Complaint: Syncope  HPI: Kurt Walker is a 57 y.o. male with medical history significant of hypertension, hyperlipidemia, GERD, asthma presenting after syncopal event.  Patient has been experiencing 2 weeks of increasing shortness of breath on exertion.  Today when climbing up the stairs she felt faint/lightheaded and then had a brief loss of consciousness.  Denies hitting his head.  Reports he did see his PCP 2 weeks ago and was told he had either a increased murmur or new murmur.  Was due for cardiology follow-up on the 20th and was going to discuss it then.  Urine  Denies fevers, chills, chest pain, abdominal pain, constipation, diarrhea, nausea, vomiting.  ED Course: Vital signs in the ED notable for blood pressure in the 130s to 160s systolic, heart rate in the 50s to 60s.  Lab workup included BMP with calcium 8.8.  CBC within normal limits.  Troponin mildly elevated at 56 and then 64 on repeat.  BNP normal.  Chest x-ray showed no acute normality.  Patient received aspirin and Ativan in the ED.  Review of Systems: As per HPI otherwise all other systems reviewed and are negative.  Past Medical History:  Diagnosis Date   Allergic rhinitis    Asthma    Coronary artery calcification of native artery    GERD (gastroesophageal reflux disease)    High cholesterol    Hypertension    Kidney stones     Past Surgical History:  Procedure Laterality Date   COLONOSCOPY     HARDWARE REMOVAL Left 06/18/2015   Procedure: LEFT THIRD METATARSAL REMOVAL OF DEEP IMPLANT;  Surgeon: Toni Arthurs, MD;  Location: [facility];  Service: Orthopedics;  Laterality: Left;   METATARSAL OSTEOTOMY Left 01/01/2015   Procedure: LEFT THIRD METATARSAL HEAD OSTEOTOMY ;  Surgeon: Toni Arthurs, MD;  Location: [facility];  Service:  Orthopedics;  Laterality: Left;   NOSE SURGERY     reset fx and cosmetic   TENDON TRANSFER Left 06/18/2015   Procedure: LEFT FLEXOR TO EXTENSOR TRANSFER;  Surgeon: Toni Arthurs, MD;  Location: [facility];  Service: Orthopedics;  Laterality: Left;   UPPER GASTROINTESTINAL ENDOSCOPY      Social History  reports that he has never smoked. He has never used smokeless tobacco. He reports current alcohol use. He reports that he does not use drugs.  No Known Allergies  Family History  Problem Relation Age of Onset   Heart attack Mother    Healthy Father    Colon cancer Neg Hx    Esophageal cancer Neg Hx    Rectal cancer Neg Hx    Stomach cancer Neg Hx   Reviewed on admission  Prior to Admission medications   Medication Sig Start Date End Date Taking? Authorizing Provider  acetaminophen (TYLENOL) 500 MG tablet Take 1,000 mg by mouth every 6 (six) hours as needed.   Yes [provider]  ADVAIR DISKUS 250-50 MCG/DOSE AEPB USE ONE INHALATION DAILY (DISCARD 30 DAYS AFTER OPENING) RINSE MOUTH 12/14/12  Yes Young, Clinton D, MD  co-enzyme Q-10 30 MG capsule Take 30 mg by mouth daily.   Yes [provider]  ezetimibe (ZETIA) 10 MG tablet Take 10 mg by mouth daily. 07/31/20  Yes [provider]  mometasone (NASONEX) 50 MCG/ACT nasal spray Place 1 spray into the nose  daily. 12/17/11  Yes [provider]  omeprazole (PRILOSEC) 20 MG capsule Take 20 mg by mouth daily.   Yes [provider]  ramipril (ALTACE) 5 MG capsule Take 5 mg by mouth daily.   Yes [provider]  rosuvastatin (CRESTOR) 20 MG tablet Take 1 tablet (20 mg total) by mouth daily. 03/31/22  Yes Custovic, Rozell Searing, DO  valACYclovir (VALTREX) 500 MG tablet Take 500 mg by mouth daily as needed. 08/06/19  Yes [provider]  ASPIRIN LOW DOSE 81 MG EC tablet TAKE 1 TABLET (81 MG TOTAL) BY MOUTH DAILY. SWALLOW WHOLE. Patient not taking: Reported on 08/23/2022 10/19/20    Kurt Lerner, DO    Physical Exam: Vitals:   08/23/22 1239 08/23/22 1500 08/23/22 1718  BP: 135/86 (!) 153/94 (!) 161/95  Pulse: (!) 59 (!) 57 60  Resp: 13 13 16   Temp: 98 F (36.7 C)  98.3 F (36.8 C)  TempSrc: Oral  Oral  SpO2: 100% 100% 100%    Physical Exam Constitutional:      General: He is not in acute distress.    Appearance: Normal appearance.  HENT:     Head: Normocephalic and atraumatic.     Mouth/Throat:     Mouth: Mucous membranes are moist.     Pharynx: Oropharynx is clear.  Eyes:     Extraocular Movements: Extraocular movements intact.     Pupils: Pupils are equal, round, and reactive to light.  Cardiovascular:     Rate and Rhythm: Normal rate and regular rhythm.     Pulses: Normal pulses.     Heart sounds: Murmur heard.  Pulmonary:     Effort: Pulmonary effort is normal. No respiratory distress.     Breath sounds: Normal breath sounds.  Abdominal:     General: Bowel sounds are normal. There is no distension.     Palpations: Abdomen is soft.     Tenderness: There is no abdominal tenderness.  Musculoskeletal:        General: No swelling or deformity.  Skin:    General: Skin is warm and dry.  Neurological:     General: No focal deficit present.     Mental Status: Mental status is at baseline.    Labs on Admission: I have personally reviewed following labs and imaging studies  CBC: Recent Labs  Lab 08/23/22 1251  WBC 5.2  HGB 13.7  HCT 40.0  MCV 92.2  PLT 194    Basic Metabolic Panel: Recent Labs  Lab 08/23/22 1251  NA 139  K 3.8  CL 107  CO2 23  GLUCOSE 90  BUN 9  CREATININE 0.80  CALCIUM 8.8*    GFR: CrCl cannot be calculated (Unknown ideal weight.).  Liver Function Tests: No results for input(s): "AST", "ALT", "ALKPHOS", "BILITOT", "PROT", "ALBUMIN" in the last 168 hours.  Urine analysis:    Component Value Date/Time   COLORURINE YELLOW 03/30/2012 1638   APPEARANCEUR CLOUDY (A) 03/30/2012 1638   LABSPEC 1.018  03/30/2012 1638   PHURINE 8.0 03/30/2012 1638   GLUCOSEU NEGATIVE 03/30/2012 1638   HGBUR LARGE (A) 03/30/2012 1638   BILIRUBINUR NEGATIVE 03/30/2012 1638   KETONESUR 15 (A) 03/30/2012 1638   PROTEINUR 30 (A) 03/30/2012 1638   UROBILINOGEN 0.2 03/30/2012 1638   NITRITE NEGATIVE 03/30/2012 1638   LEUKOCYTESUR NEGATIVE 03/30/2012 1638    Radiological Exams on Admission: DG Chest Port 1 View  Result Date: 08/23/2022 CLINICAL DATA:  Chest pain EXAM: PORTABLE CHEST - 1 VIEW COMPARISON:  04/12/2017 FINDINGS: Lungs are clear.  No pneumothorax. Heart size and mediastinal contours are within normal limits. No effusion. Visualized bones unremarkable. IMPRESSION: No acute cardiopulmonary disease. Electronically Signed   By: Corlis Leak M.D.   On: 08/23/2022 14:00    EKG: Independently reviewed.  Sinus rhythm at 59 bpm.  Assessment/Plan Principal Problem:   Syncope and collapse Active Problems:   HLD (hyperlipidemia)   Asthma   Hypertension   GERD (gastroesophageal reflux disease)   Syncope and collapse Dyspnea on exertion > Patient presenting after episode of syncope and collapse when trying to climb up stairs today.  Has had a couple weeks of worsening dyspnea on exertion.  Has been told by outpatient provider he has a murmur which is either new or louder. > Was due for cardiology follow-up later this month already.  Normal BNP.  Mild troponin bump at 56 and then 64 on repeat. > Follows with [facility] cardiology if consultation needed while admitted.  Of note his mother had a fatal heart attack in her 27s.  His sister had a stroke earlier this year at the age of 69. - Monitor on telemetry - Orthostatic vital signs - Echocardiogram - Trend renal function and electrolytes - Trend troponin to peak  Hypertension - Continue home ramipril  Hyperlipidemia - Continue home Zetia, rosuvastatin  GERD - Continue PPI  Asthma - Replace home Advair with formulary Dulera   DVT  prophylaxis: Lovenox Code Status:   Full Family Communication:  None on admission Disposition Plan:   Patient is from:  Home  Anticipated DC to:  Home  Anticipated DC date:  1 to 2 days  Anticipated DC barriers: None  Consults called:  None Admission status:  Observation, telemetry  Severity of Illness: The appropriate patient status for this patient is INPATIENT. Inpatient status is judged to be reasonable and necessary in order to provide the required intensity of service to ensure the patient's safety. The patient's presenting symptoms, physical exam findings, and initial radiographic and laboratory data in the context of their chronic comorbidities is felt to place them at high risk for further clinical deterioration. Furthermore, it is not anticipated that the patient will be medically stable for discharge from the hospital within 2 midnights of admission.   * I certify that at the point of admission it is my clinical judgment that the patient will require inpatient hospital care spanning beyond 2 midnights from the point of admission due to high intensity of service, high risk for further deterioration and high frequency of surveillance required.Synetta Fail MD [facility]  How to contact the [facility] Attending or Consulting provider 7A - 7P or covering provider during after hours 7P -7A, for this patient?   Check the care team in [facility] and look for a) attending/consulting [facility] provider listed and b) the [facility] team listed Log into [email or web address] and use [facility] universal password to access. If you do not have the password, please contact the hospital operator. Locate the [facility] provider you are looking for under [facility] and page to a number that you can be directly reached. If you still have difficulty reaching the provider, please page the [facility] (Director on Call) for the Hospitalists listed on amion for assistance.  08/23/2022, 5:54 PM

## 2022-08-24 ENCOUNTER — Other Ambulatory Visit (HOSPITAL_COMMUNITY): Payer: BC Managed Care – PPO

## 2022-08-24 ENCOUNTER — Inpatient Hospital Stay (HOSPITAL_BASED_OUTPATIENT_CLINIC_OR_DEPARTMENT_OTHER): Payer: BC Managed Care – PPO

## 2022-08-24 ENCOUNTER — Encounter (HOSPITAL_COMMUNITY): Payer: Self-pay | Admitting: Internal Medicine

## 2022-08-24 DIAGNOSIS — R55 Syncope and collapse: Principal | ICD-10-CM | POA: Diagnosis present

## 2022-08-24 LAB — ECHOCARDIOGRAM COMPLETE
AR max vel: 3.4 cm2
AV Area VTI: 3.48 cm2
AV Area mean vel: 3.15 cm2
AV Mean grad: 11 mmHg
AV Peak grad: 14.1 mmHg
Ao pk vel: 1.88 m/s
Area-P 1/2: 2.53 cm2
Height: 71 in
S' Lateral: 2.6 cm
Weight: 2924.82 oz

## 2022-08-24 LAB — BASIC METABOLIC PANEL
Anion gap: 13 (ref 5–15)
BUN: 11 mg/dL (ref 6–20)
CO2: 20 mmol/L — ABNORMAL LOW (ref 22–32)
Calcium: 8.9 mg/dL (ref 8.9–10.3)
Chloride: 102 mmol/L (ref 98–111)
Creatinine, Ser: 1.01 mg/dL (ref 0.61–1.24)
GFR, Estimated: >60 mL/min (ref >60)
Glucose, Bld: 85 mg/dL (ref 70–99)
Potassium: 4.6 mmol/L (ref 3.5–5.1)
Sodium: 135 mmol/L (ref 135–145)

## 2022-08-24 LAB — GLUCOSE, CAPILLARY: Glucose-Capillary: 95 mg/dL (ref 70–99)

## 2022-08-24 LAB — D-DIMER, QUANTITATIVE: D-Dimer, Quant: <0.27 ug/mL-FEU (ref 0.00–0.50)

## 2022-08-24 MED ORDER — PERFLUTREN LIPID MICROSPHERE
1.0000 mL | INTRAVENOUS | Status: AC | PRN
  Administered 2022-08-24: 2 mL via INTRAVENOUS

## 2022-08-24 MED ORDER — SODIUM CHLORIDE 0.9 % IV SOLN: INTRAVENOUS | Status: DC

## 2022-08-24 MED ORDER — POTASSIUM CHLORIDE CRYS ER 20 MEQ PO TBCR
40.0000 meq | EXTENDED_RELEASE_TABLET | Freq: Once | ORAL | Status: AC
  Administered 2022-08-24: 40 meq via ORAL
  Filled 2022-08-24: qty 2

## 2022-08-24 MED ORDER — METOPROLOL SUCCINATE ER 25 MG PO TB24: 25.0000 mg | ORAL_TABLET | Freq: Every day | ORAL | 1 refills | Status: DC

## 2022-08-24 MED ORDER — DIAZEPAM 5 MG PO TABS
5.0000 mg | ORAL_TABLET | ORAL | Status: DC | PRN
  Administered 2022-08-25 (×2): 5 mg via ORAL
  Filled 2022-08-24 (×2): qty 1

## 2022-08-24 MED ORDER — METOPROLOL SUCCINATE ER 25 MG PO TB24
25.0000 mg | ORAL_TABLET | Freq: Every day | ORAL | Status: DC
  Administered 2022-08-24 – 2022-08-25 (×2): 25 mg via ORAL
  Filled 2022-08-24 (×2): qty 1

## 2022-08-24 NOTE — Consult Note (Addendum)
CARDIOLOGY CONSULT NOTE  Patient ID: Kurt Walker MRN: [id number] DOB/AGE: [birth year] 56 y.o.  Admit date: 08/23/2022 Referring Physician: [facility] Reason for Consultation:  HCM  HPI:   57 y.o.  Caucasian male with known LAD calcification, family h/o early CAD, now admitted with exertional presyncope, mildly elevated troponin.   Patient works in Copywriter, advertising, stays somewhat physically active at work. He walks 2 miles at a stretch at 16 min/mile pace. Recently. He has experienced exertional dyspnea which has slowed him down. In addition. He has had exertional lightheadedness ad presyncope, without any frank syncope. Most severe of those episodes occurred yesterday after he climbed up 24 steps at his child's daycare. He got "blacked out" after reaching top of the staircase, felt lightheaded with palpitations. He does not think he lost consciousness completely. He denies chest pain, orthopnea, PND, leg edema.   Workup so far has showed mildly elevated. But flat troponin, negative d-dimer and BNP. Echocardiogram showed only modest basal septal hypertrophy, with minimal SAM and Valsalva provoked LVOT max gradient of 20 mmHg.   Patient has been seen in our practice before with most recent echocardiogram in 2021, not very convincing for LVOT gradient.  He is a nonsmoker. He was found to have calcium score of 132 in LAD around 2020. Exercise nuclear stress test in 08/2019 shoed equivocal ischemia changes in inferolateral leads, no ischemia on SPECT imaging. His mother had autopsy proven fatal MI at age 53.   He is married, has wife, 4 boys aged 31, 33, and 50 y/o twins.   Past Medical History:  Diagnosis Date   Allergic rhinitis    Asthma    Coronary artery calcification of native artery    GERD (gastroesophageal reflux disease)    High cholesterol    Hypertension    Kidney stones      Past Surgical History:  Procedure Laterality Date   COLONOSCOPY     HARDWARE REMOVAL Left  06/18/2015   Procedure: LEFT THIRD METATARSAL REMOVAL OF DEEP IMPLANT;  Surgeon: Toni Arthurs, MD;  Location: [facility];  Service: Orthopedics;  Laterality: Left;   METATARSAL OSTEOTOMY Left 01/01/2015   Procedure: LEFT THIRD METATARSAL HEAD OSTEOTOMY ;  Surgeon: Toni Arthurs, MD;  Location: [facility];  Service: Orthopedics;  Laterality: Left;   NOSE SURGERY     reset fx and cosmetic   TENDON TRANSFER Left 06/18/2015   Procedure: LEFT FLEXOR TO EXTENSOR TRANSFER;  Surgeon: Toni Arthurs, MD;  Location: [facility];  Service: Orthopedics;  Laterality: Left;   UPPER GASTROINTESTINAL ENDOSCOPY        Family History  Problem Relation Age of Onset   Heart attack Mother    Healthy Father    Colon cancer Neg Hx    Esophageal cancer Neg Hx    Rectal cancer Neg Hx    Stomach cancer Neg Hx      Social History: Social History   Socioeconomic History   Marital status: Married    Spouse name: Not on file   Number of children: 4   Years of education: Not on file   Highest education level: Not on file  Occupational History   Not on file  Tobacco Use   Smoking status: Never   Smokeless tobacco: Never  Vaping Use   Vaping status: Never Used  Substance and Sexual Activity   Alcohol use: Yes    Comment: rarly   Drug use: No   Sexual activity: Not  on file  Other Topics Concern   Not on file  Social History Narrative   Remarried, son and daughter. Self-employed- ice IT trainer.    1 deceased son   Social Determinants of Health   Financial Resource Strain: Low Risk  (08/24/2022)   Overall Financial Resource Strain (CARDIA)    Difficulty of Paying Living Expenses: Not hard at all  Food Insecurity: No Food Insecurity (08/24/2022)   Hunger Vital Sign    Worried About Running Out of Food in the Last Year: Never true    Ran Out of Food in the Last Year: Never true  Transportation Needs: No Transportation Needs (08/24/2022)    PRAPARE - Administrator, Civil Service (Medical): No    Lack of Transportation (Non-Medical): No  Physical Activity: Insufficiently Active (08/24/2022)   Exercise Vital Sign    Days of Exercise per Week: 2 days    Minutes of Exercise per Session: 30 min  Stress: No Stress Concern Present (08/24/2022)   [employer] of Occupational Health - Occupational Stress Questionnaire    Feeling of Stress : Not at all  Social Connections: Moderately Integrated (08/24/2022)   Social Connection and Isolation Panel [NHANES]    Frequency of Communication with Friends and Family: More than three times a week    Frequency of Social Gatherings with Friends and Family: Three times a week    Attends Religious Services: 1 to 4 times per year    Active Member of Clubs or Organizations: No    Attends Banker Meetings: Never    Marital Status: Married  Catering manager Violence: Not At Risk (08/24/2022)   Humiliation, Afraid, Rape, and Kick questionnaire    Fear of Current or Ex-Partner: No    Emotionally Abused: No    Physically Abused: No    Sexually Abused: No     Medications Prior to Admission  Medication Sig Dispense Refill Last Dose   acetaminophen (TYLENOL) 500 MG tablet Take 1,000 mg by mouth every 6 (six) hours as needed.      ADVAIR DISKUS 250-50 MCG/DOSE AEPB USE ONE INHALATION DAILY (DISCARD 30 DAYS AFTER OPENING) RINSE MOUTH 3 each 0 08/23/2022   co-enzyme Q-10 30 MG capsule Take 30 mg by mouth daily.   08/23/2022   ezetimibe (ZETIA) 10 MG tablet Take 10 mg by mouth daily.   Past Week   mometasone (NASONEX) 50 MCG/ACT nasal spray Place 1 spray into the nose daily.   08/23/2022   omeprazole (PRILOSEC) 20 MG capsule Take 20 mg by mouth daily.   08/23/2022   ramipril (ALTACE) 5 MG capsule Take 5 mg by mouth daily.   08/23/2022   rosuvastatin (CRESTOR) 20 MG tablet Take 1 tablet (20 mg total) by mouth daily. 90 tablet 3 08/23/2022   valACYclovir (VALTREX) 500 MG tablet Take 500 mg by  mouth daily as needed.   08/23/2022   ASPIRIN LOW DOSE 81 MG EC tablet TAKE 1 TABLET (81 MG TOTAL) BY MOUTH DAILY. SWALLOW WHOLE. (Patient not taking: Reported on 08/23/2022) 90 tablet 3 Not Taking    Review of Systems  Cardiovascular:  Positive for dyspnea on exertion. Negative for chest pain, leg swelling, palpitations and syncope.  Neurological:  Positive for light-headedness (Near syncope).     Vitals:   08/24/22 0805 08/24/22 1141  BP: 131/78 121/80  Pulse: 60 65  Resp: 16 16  Temp: 98 F (36.7 C) 97.7 F (36.5 C)  SpO2:  Physical Exam: Physical Exam Vitals and nursing note reviewed.  Constitutional:      General: He is not in acute distress. Neck:     Vascular: No JVD.  Cardiovascular:     Rate and Rhythm: Normal rate and regular rhythm.     Heart sounds: Murmur heard.     High-pitched harsh early systolic murmur is present with a grade of 1/6 at the upper right sternal border. Minimal increase in mutmut with Valsalva  Pulmonary:     Effort: Pulmonary effort is normal.     Breath sounds: Normal breath sounds. No wheezing or rales.  Musculoskeletal:     Right lower leg: No edema.     Left lower leg: No edema.        Imaging/tests reviewed and independently interpreted: Lab Results:   Cardiac Studies:  Telemetry 08/24/2022: Sinus rhythm No arrhythmia  EKG 08/24/2022: Sinus rhythm Probable left atrial enlargement Borderline T abnormalities, lateral leads No acute changes  Echocardiogram 08/24/2022:  1. Moderate basal septal hypertrophy with turbulent flow in the LVOT and  SAM with peak LVOT gradient of with Valsalva. Findings suggestive  of HOCM. Marland Kitchen Left ventricular ejection fraction, by estimation, is 65 to  70%. The left ventricle has normal  function. The left ventricle has no regional wall motion abnormalities.  There is moderate left ventricular hypertrophy of the basal-septal  segment. Left ventricular diastolic parameters are indeterminate.  The  average left ventricular global longitudinal  strain is -19.3 %. The global longitudinal strain is normal.   2. Right ventricular systolic function is normal. The right ventricular  size is normal.   3. The mitral valve is normal in structure. Trivial mitral valve  regurgitation. No evidence of mitral stenosis.   4. The aortic valve is tricuspid. Aortic valve regurgitation is not  visualized. Aortic valve sclerosis/calcification is present, without any  evidence of aortic stenosis. Aortic valve area, by VTI measures 3.48 cm.  Aortic valve mean gradient measures  11.0 mmHg. Aortic valve Vmax measures 1.88 m/s.   5. The inferior vena cava is normal in size with greater than 50%  respiratory variability, suggesting right atrial pressure of 3 mmHg.   6. Consider cardiac MRI to evaluate for HOCM.    Assessment & Recommendations:  57 y.o.  Caucasian male with known LAD calcification, family h/o early CAD, now admitted with exertional presyncope, mildly elevated troponin.   Near syncope: Exertional nature of near syncope raises concern for dynamic LVOT obstruction. Hoeve,r his physical exam, EKG, and even echocardiogram are not very convincing for such etiology. He has minimal SAM with modest basal septal hypertrophy and LVOT gradient. There are no other signs to suggest HCM-such as LA enlargement, MR etc. I do not think there is any significant degree of aortic valvular, or subvalvular stenosis. There are no arrhthymias noted. There is no family h/o unclear sudden death. I do think cardiac MRI will be valuable to rule in or rule out HCM. If no HCM noted on MRI, then other differentials would include angina equivalent, as well as other arrhythmia-with or without HCM correlation. Troponin elevation is likely demand ischemia, unlikely ACS.   Recommend cardiac MRI. He is claustrophobic so ordered pre MRI Valium. In the meantime, discontinued ramipril, started metoprolol succinate 25 mg daily.    Will follow up after MRI.   Discussed interpretation of tests and management recommendations with the primary team     Elder Negus, MD Pager: [phone] Office: [phone]

## 2022-08-24 NOTE — Progress Notes (Signed)
Echocardiogram 2D Echocardiogram has been performed.  Warren Lacy Ashantia Amaral RDCS 08/24/2022, 4:46 PM

## 2022-08-24 NOTE — Plan of Care (Signed)

## 2022-08-24 NOTE — Progress Notes (Signed)
57 y/o Caucasian male with known LAD calcification, family h/o early CAD, now admitted with exertional presyncope, mildly elevated troponin. Echocardiogram with mild SAM and Valsalva provoked LVOT gradient max of 20 mmHg. Recommend inpatient cardiac MRI for further evaluation. If no HCM on MRI, will pursue alternate differential of angina equivalent. Stopped ramipril, started metoprolol succinate 25 mg daily.  Full consult note to follow.   Elder Negus, MD Pager: [phone] Office: [phone]

## 2022-08-24 NOTE — TOC CM/SW Note (Signed)
Transition of Care [facility]) - Inpatient Brief Assessment   Patient Details  Name: Kurt Walker MRN: [id number] Date of Birth: 1965-04-20  Transition of Care [facility]) CM/SW Contact:    Gala Lewandowsky, RN Phone Number: 08/24/2022, 11:51 AM   Clinical Narrative: Patient presented for syncope-plan for echo. Patient has insurance and PCP. Case Manager will continue to follow for transition of care needs as the patient progresses.   Transition of Care Asessment: Insurance and Status: Insurance coverage has been reviewed Patient has primary care physician: Yes  Prior/Current Home Services: No current home services Social Determinants of Health Reivew: SDOH reviewed no interventions necessary Readmission risk has been reviewed: Yes Transition of care needs: no transition of care needs at this time

## 2022-08-24 NOTE — Discharge Summary (Signed)
Physician Discharge Summary  Kurt Walker:[id number] DOB: 1965/05/17 DOA: 08/23/2022  PCP: Daisy Floro, MD  Admit date: 08/23/2022 Discharge date: 08/24/2022 30 Day Unplanned Readmission Risk Score    Flowsheet Row ED to Hosp-Admission (Current) from 08/23/2022 in [facility]  30 Day Unplanned Readmission Risk Score (%) 10.39 Filed at 08/24/2022 1200       This score is the patient's risk of an unplanned readmission within 30 days of being discharged (0 -100%). The score is based on dignosis, age, lab data, medications, orders, and past utilization.   Low:  0-14.9   Medium: 15-21.9   High: 22-29.9   Extreme: 30 and above          Admitted From: Home Disposition: Home  Recommendations for Outpatient Follow-up:  Follow up with PCP in 1-2 weeks Please obtain BMP/CBC in one week Please follow up with your PCP on the following pending results: Unresulted Labs (From admission, onward)     Start     Ordered   08/30/22 0500  Creatinine, serum  (enoxaparin (LOVENOX)    CrCl >/= 30 ml/min)  Weekly,   R     Comments: while on enoxaparin therapy   Question:  Specimen collection method  Answer:  Lab=Lab collect   08/23/22 1753              Home Health: None Equipment/Devices: None  Discharge Condition: Stable CODE STATUS: Full code Diet recommendation: Cardiac  Following HPI and ED course is copied from admitting hospitalist H&P. HPI: Kurt Walker is a 57 y.o. male with medical history significant of hypertension, hyperlipidemia, GERD, asthma presenting after syncopal event.   Patient has been experiencing 2 weeks of increasing shortness of breath on exertion.  Today when climbing up the stairs she felt faint/lightheaded and then had a brief loss of consciousness.  Denies hitting his head.  Reports he did see his PCP 2 weeks ago and was told he had either a increased murmur or new murmur.  Was due for cardiology follow-up on the 20th and was going  to discuss it then.  Urine   Denies fevers, chills, chest pain, abdominal pain, constipation, diarrhea, nausea, vomiting.   ED Course: Vital signs in the ED notable for blood pressure in the 130s to 160s systolic, heart rate in the 50s to 60s.  Lab workup included BMP with calcium 8.8.  CBC within normal limits.  Troponin mildly elevated at 56 and then 64 on repeat.  BNP normal.  Chest x-ray showed no acute normality.  Patient received aspirin and Ativan in the ED.    Subjective: Seen and examined.  No complaints.  No chest pain, palpitation or shortness of breath.  Brief/Interim Summary: Patient was admitted with syncope and collapse and dyspnea on exertion.  Troponins slightly elevated but flat indicative of demand ischemia.  No events were found on EKG and on telemetry.  Orthostatic vital signs were stable.  Blood pressure remained stable during the brief hospitalization.  Chest x-ray negative.  D-dimer negative.  Interestingly, he says that food intake sometimes can cause dyspnea.  Echo however showed signs of HOCM.  EF was normal.  I discussed the case with [facility] cardiologist on-call Dr. Rosemary Holms who basically gave the option for the patient to either stay tonight so he can be seen either later tonight or tomorrow morning as he was scrubbed in and was going to do the procedure since it is already 5:30 PM and he also  gave the option for the patient to be discharged and he will see patient in the clinic tomorrow.  I had a discussion with the wife.  All questions answered.  She said that her husband would feel comfortable at home and she preferred going home and seeing cardiology in the morning.  I have relayed this information to Dr. Rosemary Holms via secure chat as well.  Patient is hemodynamically stable and asymptomatic at this point in time.  Discharge plan was discussed with patient and/or family member and they verbalized understanding and agreed with it.  Discharge Diagnoses:  Principal  Problem:   Syncope and collapse Active Problems:   HLD (hyperlipidemia)   Asthma   Hypertension   GERD (gastroesophageal reflux disease)   Syncope    Discharge Instructions   Allergies as of 08/24/2022   No Known Allergies      Medication List     STOP taking these medications    Aspirin Low Dose 81 MG tablet Generic drug: aspirin EC       TAKE these medications    acetaminophen 500 MG tablet Commonly known as: TYLENOL Take 1,000 mg by mouth every 6 (six) hours as needed.   Advair Diskus 250-50 MCG/DOSE Aepb Generic drug: fluticasone-salmeterol USE ONE INHALATION DAILY (DISCARD 30 DAYS AFTER OPENING) RINSE MOUTH   co-enzyme Q-10 30 MG capsule Take 30 mg by mouth daily.   ezetimibe 10 MG tablet Commonly known as: ZETIA Take 10 mg by mouth daily.   mometasone 50 MCG/ACT nasal spray Commonly known as: NASONEX Place 1 spray into the nose daily.   omeprazole 20 MG capsule Commonly known as: PRILOSEC Take 20 mg by mouth daily.   ramipril 5 MG capsule Commonly known as: ALTACE Take 5 mg by mouth daily.   rosuvastatin 20 MG tablet Commonly known as: CRESTOR Take 1 tablet (20 mg total) by mouth daily.   valACYclovir 500 MG tablet Commonly known as: VALTREX Take 500 mg by mouth daily as needed.        Follow-up Information     Daisy Floro, MD Follow up in 1 week(s).   Specialty: Family Medicine Contact information: [address] [place] [address] [phone]                No Known Allergies  Consultations: None   Procedures/Studies: DG Chest Port 1 View  Result Date: 08/23/2022 CLINICAL DATA:  Chest pain EXAM: PORTABLE CHEST - 1 VIEW COMPARISON:  04/12/2017 FINDINGS: Lungs are clear.  No pneumothorax. Heart size and mediastinal contours are within normal limits. No effusion. Visualized bones unremarkable. IMPRESSION: No acute cardiopulmonary disease. Electronically Signed   By: Corlis Leak M.D.   On: 08/23/2022 14:00      Discharge Exam: Vitals:   08/24/22 0805 08/24/22 1141  BP: 131/78 121/80  Pulse: 60 65  Resp: 16 16  Temp: 98 F (36.7 C) 97.7 F (36.5 C)  SpO2:     Vitals:   08/24/22 0748 08/24/22 0805 08/24/22 0958 08/24/22 1141  BP:  131/78  121/80  Pulse:  60  65  Resp:  16  16  Temp:  98 F (36.7 C)  97.7 F (36.5 C)  TempSrc:  Oral  Oral  SpO2: 100%     Weight:   82.9 kg   Height:   5\' 11"  (1.803 m)     General: Pt is alert, awake, not in acute distress Cardiovascular: RRR, S1/S2 +, no rubs, no gallops Respiratory: CTA bilaterally, no  wheezing, no rhonchi Abdominal: Soft, NT, ND, bowel sounds + Extremities: no edema, no cyanosis    The results of significant diagnostics from this hospitalization (including imaging, microbiology, ancillary and laboratory) are listed below for reference.     Microbiology: No results found for this or any previous visit (from the past 240 hour(s)).   Labs: BNP (last 3 results) Recent Labs    08/23/22 1251  BNP 25.5   Basic Metabolic Panel: Recent Labs  Lab 08/23/22 1251 08/24/22 0140 08/24/22 1003  NA 139 139 135  K 3.8 3.4* 4.6  CL 107 106 102  CO2 23 26 20*  GLUCOSE 90 98 85  BUN 9 11 11   CREATININE 0.80 1.33* 1.01  CALCIUM 8.8* 8.6* 8.9   Liver Function Tests: Recent Labs  Lab 08/24/22 0140  AST 18  ALT 18  ALKPHOS 36*  BILITOT 0.8  PROT 5.9*  ALBUMIN 3.5   No results for input(s): "LIPASE", "AMYLASE" in the last 168 hours. No results for input(s): "AMMONIA" in the last 168 hours. CBC: Recent Labs  Lab 08/23/22 1251 08/24/22 0140  WBC 5.2 4.9  HGB 13.7 13.2  HCT 40.0 40.3  MCV 92.2 93.7  PLT 194 185   Cardiac Enzymes: No results for input(s): "CKTOTAL", "CKMB", "CKMBINDEX", "TROPONINI" in the last 168 hours. BNP: Invalid input(s): "POCBNP" CBG: Recent Labs  Lab 08/24/22 0558  GLUCAP 95   D-Dimer Recent Labs    08/24/22 1003  DDIMER <0.27   Hgb A1c No results for input(s): "HGBA1C" in  the last 72 hours. Lipid Profile No results for input(s): "CHOL", "HDL", "LDLCALC", "TRIG", "CHOLHDL", "LDLDIRECT" in the last 72 hours. Thyroid function studies No results for input(s): "TSH", "T4TOTAL", "T3FREE", "THYROIDAB" in the last 72 hours.  Invalid input(s): "FREET3" Anemia work up No results for input(s): "VITAMINB12", "FOLATE", "FERRITIN", "TIBC", "IRON", "RETICCTPCT" in the last 72 hours. Urinalysis    Component Value Date/Time   COLORURINE YELLOW 03/30/2012 1638   APPEARANCEUR CLOUDY (A) 03/30/2012 1638   LABSPEC 1.018 03/30/2012 1638   PHURINE 8.0 03/30/2012 1638   GLUCOSEU NEGATIVE 03/30/2012 1638   HGBUR LARGE (A) 03/30/2012 1638   BILIRUBINUR NEGATIVE 03/30/2012 1638   KETONESUR 15 (A) 03/30/2012 1638   PROTEINUR 30 (A) 03/30/2012 1638   UROBILINOGEN 0.2 03/30/2012 1638   NITRITE NEGATIVE 03/30/2012 1638   LEUKOCYTESUR NEGATIVE 03/30/2012 1638   Sepsis Labs Recent Labs  Lab 08/23/22 1251 08/24/22 0140  WBC 5.2 4.9   Microbiology No results found for this or any previous visit (from the past 240 hour(s)).  FURTHER DISCHARGE INSTRUCTIONS:   Get Medicines reviewed and adjusted: Please take all your medications with you for your next visit with your Primary MD   Laboratory/radiological data: Please request your Primary MD to go over all hospital tests and procedure/radiological results at the follow up, please ask your Primary MD to get all Hospital records sent to his/her office.   In some cases, they will be blood work, cultures and biopsy results pending at the time of your discharge. Please request that your primary care M.D. goes through all the records of your hospital data and follows up on these results.   Also Note the following: If you experience worsening of your admission symptoms, develop shortness of breath, life threatening emergency, suicidal or homicidal thoughts you must seek medical attention immediately by calling 911 or calling your MD  immediately  if symptoms less severe.   You must read complete instructions/literature along with all the  possible adverse reactions/side effects for all the Medicines you take and that have been prescribed to you. Take any new Medicines after you have completely understood and accpet all the possible adverse reactions/side effects.    Do not drive when taking Pain medications or sleeping medications (Benzodaizepines)   Do not take more than prescribed Pain, Sleep and Anxiety Medications. It is not advisable to combine anxiety,sleep and pain medications without talking with your primary care practitioner   Special Instructions: If you have smoked or chewed Tobacco  in the last 2 yrs please stop smoking, stop any regular Alcohol  and or any Recreational drug use.   Wear Seat belts while driving.   Please note: You were cared for by a hospitalist during your hospital stay. Once you are discharged, your primary care physician will handle any further medical issues. Please note that NO REFILLS for any discharge medications will be authorized once you are discharged, as it is imperative that you return to your primary care physician (or establish a relationship with a primary care physician if you do not have one) for your post hospital discharge needs so that they can reassess your need for medications and monitor your lab values  Time coordinating discharge: Over 30 minutes  SIGNED:   Hughie Closs, MD  [facility] 08/24/2022, 4:00 PM *Please note that this is a verbal dictation therefore any spelling or grammatical errors are due to the "[employer] One" system interpretation. If 7PM-7AM, please contact night-coverage [email or web address]

## 2022-08-24 NOTE — Plan of Care (Signed)

## 2022-08-25 ENCOUNTER — Other Ambulatory Visit: Payer: Self-pay | Admitting: Cardiology

## 2022-08-25 ENCOUNTER — Encounter: Payer: Self-pay | Admitting: Cardiology

## 2022-08-25 ENCOUNTER — Observation Stay (HOSPITAL_BASED_OUTPATIENT_CLINIC_OR_DEPARTMENT_OTHER): Payer: BC Managed Care – PPO

## 2022-08-25 DIAGNOSIS — I340 Nonrheumatic mitral (valve) insufficiency: Secondary | ICD-10-CM

## 2022-08-25 DIAGNOSIS — R42 Dizziness and giddiness: Secondary | ICD-10-CM

## 2022-08-25 LAB — GLUCOSE, CAPILLARY: Glucose-Capillary: 90 mg/dL (ref 70–99)

## 2022-08-25 MED ORDER — ASPIRIN 81 MG PO TBEC
81.0000 mg | DELAYED_RELEASE_TABLET | Freq: Every day | ORAL | 3 refills | Status: DC
Start: 2022-08-25 — End: 2022-08-25

## 2022-08-25 MED ORDER — GADOBUTROL 1 MMOL/ML IV SOLN
8.0000 mL | Freq: Once | INTRAVENOUS | Status: AC | PRN
  Administered 2022-08-25: 8 mL via INTRAVENOUS

## 2022-08-25 MED ORDER — ASPIRIN 81 MG PO TBEC
81.0000 mg | DELAYED_RELEASE_TABLET | Freq: Every day | ORAL | 0 refills | Status: AC
Start: 2022-08-25 — End: ?

## 2022-08-25 NOTE — Plan of Care (Signed)

## 2022-08-25 NOTE — Plan of Care (Signed)

## 2022-08-25 NOTE — Progress Notes (Signed)
Pending cardiac MRI results, okay to discharge home. Medication changes made with stopping ramipril and starting metoprolol succinate 25 mg daily, will also add Aspirin 81 g daily. Will arrange outpatient cardiac telemetry monitor and office f/u.  Avoid heavy physical exertion that tend to reproduce symptoms. 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    Elder Negus, MD Pager: [phone] Office: [phone]

## 2022-08-25 NOTE — Discharge Summary (Addendum)
Physician Discharge Summary  Kurt Walker ZDG:[id number] DOB: [birth year] DOA: 08/23/2022  PCP: Daisy Floro, MD  Admit date: 08/23/2022 Discharge date: 08/25/2022 30 Day Unplanned Readmission Risk Score    Flowsheet Row ED to Hosp-Admission (Current) from 08/23/2022 in [facility]  30 Day Unplanned Readmission Risk Score (%) 10.39 Filed at 08/24/2022 1200       This score is the patient's risk of an unplanned readmission within 30 days of being discharged (0 -100%). The score is based on dignosis, age, lab data, medications, orders, and past utilization.   Low:  0-14.9   Medium: 15-21.9   High: 22-29.9   Extreme: 30 and above          Admitted From: Home Disposition: Home  Recommendations for Outpatient Follow-up:  Follow up with PCP in 1-2 weeks Please obtain BMP/CBC in one week Please follow up with your PCP on the following pending results: Unresulted Labs (From admission, onward)     Start     Ordered   08/30/22 0500  Creatinine, serum  (enoxaparin (LOVENOX)    CrCl >/= 30 ml/min)  Weekly,   R     Comments: while on enoxaparin therapy   Question:  Specimen collection method  Answer:  Lab=Lab collect   08/23/22 1753              Home Health: None Equipment/Devices: None  Discharge Condition: Stable CODE STATUS: Full code Diet recommendation: Cardiac  Following HPI and ED course is copied from admitting hospitalist H&P. HPI: Kurt Walker is a 57 y.o. male with medical history significant of hypertension, hyperlipidemia, GERD, asthma presenting after syncopal event.   Patient has been experiencing 2 weeks of increasing shortness of breath on exertion.  Today when climbing up the stairs she felt faint/lightheaded and then had a brief loss of consciousness.  Denies hitting his head.  Reports he did see his PCP 2 weeks ago and was told he had either a increased murmur or new murmur.  Was due for cardiology follow-up on the 20th and was going  to discuss it then.  Urine   Denies fevers, chills, chest pain, abdominal pain, constipation, diarrhea, nausea, vomiting.   ED Course: Vital signs in the ED notable for blood pressure in the 130s to 160s systolic, heart rate in the 50s to 60s.  Lab workup included BMP with calcium 8.8.  CBC within normal limits.  Troponin mildly elevated at 56 and then 64 on repeat.  BNP normal.  Chest x-ray showed no acute normality.  Patient received aspirin and Ativan in the ED.    Subjective: Seen and examined.  No complaints.  No chest pain, palpitation or shortness of breath.  Brief/Interim Summary: Patient was admitted with syncope and collapse and dyspnea on exertion.  Troponins slightly elevated but flat indicative of demand ischemia.  No events were found on EKG and on telemetry.  Orthostatic vital signs were stable.  Blood pressure remained stable during the brief hospitalization.  Chest x-ray negative.  D-dimer negative.  Interestingly, he says that food intake sometimes can cause dyspnea.  Echo however showed signs of HOCM.  EF was normal.  I discussed the case with [facility] cardiologist on-call Dr. Rosemary Holms who basically gave the option for the patient to either stay tonight so he can be seen either later tonight or tomorrow morning as he was scrubbed in and was going to do the procedure since it is already 5:30 PM and he also  gave the option for the patient to be discharged and he will see patient in the clinic tomorrow.  I had a discussion with the wife.  All questions answered.  She said that her husband would feel comfortable at home and she preferred going home and seeing cardiology in the morning.  I have relayed this information to Dr. Rosemary Holms via secure chat as well.  Patient is hemodynamically stable and asymptomatic at this point in time.  Addendum: Patient was discharged by me on the evening of 08/24/2022 after discussing with cardiology as mentioned above however after that, patient was seen  by cardiology and they recommended to keep him overnight for cardiac MRI in the morning and possibly EP consultation.  Patient underwent MRI this morning, he is asymptomatic but results were pending.  He was started on Toprol-XL yesterday.  There was informed to me by MRI reading cardiology that it may be very late until MRI is read and patient wanted to go home so patient's rounding cardiologist Dr. Rosemary Holms cleared the patient to go home before MRI results.  He is going to arrange outpatient cardiac monitor for the patient and will call patient with MRI results and arrange further workup as needed.  He recommended discharging patient on aspirin and Toprol-XL but discontinuing ramipril.  Discharge plan was discussed with patient and/or family member and they verbalized understanding and agreed with it.  Discharge Diagnoses:  Principal Problem:   Syncope and collapse Active Problems:   HLD (hyperlipidemia)   Asthma   Hypertension   GERD (gastroesophageal reflux disease)   Syncope    Discharge Instructions   Allergies as of 08/25/2022   No Known Allergies      Medication List     STOP taking these medications    ramipril 5 MG capsule Commonly known as: ALTACE       TAKE these medications    acetaminophen 500 MG tablet Commonly known as: TYLENOL Take 1,000 mg by mouth every 6 (six) hours as needed.   Advair Diskus 250-50 MCG/DOSE Aepb Generic drug: fluticasone-salmeterol USE ONE INHALATION DAILY (DISCARD 30 DAYS AFTER OPENING) RINSE MOUTH   aspirin EC 81 MG tablet Commonly known as: Aspirin Low Dose Take 1 tablet (81 mg total) by mouth daily. SWALLOW WHOLE. What changed:  how much to take when to take this   co-enzyme Q-10 30 MG capsule Take 30 mg by mouth daily.   ezetimibe 10 MG tablet Commonly known as: ZETIA Take 10 mg by mouth daily.   metoprolol succinate 25 MG 24 hr tablet Commonly known as: TOPROL-XL Take 1 tablet (25 mg total) by mouth daily.    mometasone 50 MCG/ACT nasal spray Commonly known as: NASONEX Place 1 spray into the nose daily.   omeprazole 20 MG capsule Commonly known as: PRILOSEC Take 20 mg by mouth daily.   rosuvastatin 20 MG tablet Commonly known as: CRESTOR Take 1 tablet (20 mg total) by mouth daily.   valACYclovir 500 MG tablet Commonly known as: VALTREX Take 500 mg by mouth daily as needed.        Follow-up Information     Daisy Floro, MD Follow up in 1 week(s).   Specialty: Family Medicine Contact information: [address] [place] [address] [phone]                No Known Allergies  Consultations: None   Procedures/Studies: ECHOCARDIOGRAM COMPLETE  Result Date: 08/24/2022    ECHOCARDIOGRAM REPORT   Patient Name:  Kurt Walker Date of Exam: 08/24/2022 Medical Rec #:  [id number]      Height:       71.0 in Accession #:    [id number]     Weight:       182.8 lb Date of Birth:  September 12, 1965     BSA:          2.029 m Patient Age:    56 years       BP:           121/80 mmHg Patient Gender: M              HR:           57 bpm. Exam Location:  Inpatient Procedure: 2D Echo, Color Doppler, Cardiac Doppler and Intracardiac            Opacification Agent Indications:    R55 Syncope  History:        Patient has prior history of Echocardiogram examinations, most                 recent 09/03/2019. Risk Factors:Hypertension and Dyslipidemia.  Sonographer:    Irving Burton Senior RDCS Referring Phys: Cecille Po MELVIN IMPRESSIONS  1. Moderate basal septal hypertrophy with turbulent flow in the LVOT and SAM with peak LVOT gradient of with Valsalva. Findings suggestive of HOCM. Marland Kitchen Left ventricular ejection fraction, by estimation, is 65 to 70%. The left ventricle has normal function. The left ventricle has no regional wall motion abnormalities. There is moderate left ventricular hypertrophy of the basal-septal segment. Left ventricular diastolic parameters are indeterminate. The average left  ventricular global longitudinal strain is -19.3 %. The global longitudinal strain is normal.  2. Right ventricular systolic function is normal. The right ventricular size is normal.  3. The mitral valve is normal in structure. Trivial mitral valve regurgitation. No evidence of mitral stenosis.  4. The aortic valve is tricuspid. Aortic valve regurgitation is not visualized. Aortic valve sclerosis/calcification is present, without any evidence of aortic stenosis. Aortic valve area, by VTI measures 3.48 cm. Aortic valve mean gradient measures 11.0 mmHg. Aortic valve Vmax measures 1.88 m/s.  5. The inferior vena cava is normal in size with greater than 50% respiratory variability, suggesting right atrial pressure of 3 mmHg.  6. Consider cardiac MRI to evaluate for HOCM. FINDINGS  Left Ventricle: Moderate basal septal hypertrophy with turbulent flow in the LVOT and SAM with peak LVOT gradient of with Valsalva. Findings suggestive of HOCM.Consider cardiac MRI to rule out HOCM. Left ventricular ejection fraction, by estimation, is 65 to 70%. The left ventricle has normal function. The left ventricle has no regional wall motion abnormalities. Definity contrast agent was given IV to delineate the left ventricular endocardial borders. The average left ventricular global longitudinal strain is -19.3 %. The global longitudinal strain is normal. The left ventricular internal cavity size was normal in size. There is moderate left ventricular hypertrophy of the basal-septal segment. Left ventricular diastolic parameters are indeterminate. Normal left ventricular filling pressure. Right Ventricle: The right ventricular size is normal. No increase in right ventricular wall thickness. Right ventricular systolic function is normal. Left Atrium: Left atrial size was normal in size. Right Atrium: Right atrial size was normal in size. Pericardium: There is no evidence of pericardial effusion. Mitral Valve: The mitral valve is  normal in structure. Trivial mitral valve regurgitation. No evidence of mitral valve stenosis. Tricuspid Valve: The tricuspid valve is normal in structure. Tricuspid valve regurgitation is not  demonstrated. No evidence of tricuspid stenosis. Aortic Valve: The aortic valve is tricuspid. Aortic valve regurgitation is not visualized. Aortic valve sclerosis/calcification is present, without any evidence of aortic stenosis. Aortic valve mean gradient measures 11.0 mmHg. Aortic valve peak gradient  measures 14.1 mmHg. Aortic valve area, by VTI measures 3.48 cm. Pulmonic Valve: The pulmonic valve was normal in structure. Pulmonic valve regurgitation is trivial. No evidence of pulmonic stenosis. Aorta: The aortic root is normal in size and structure. Venous: The inferior vena cava is normal in size with greater than 50% respiratory variability, suggesting right atrial pressure of 3 mmHg. IAS/Shunts: No atrial level shunt detected by color flow Doppler.  LEFT VENTRICLE PLAX 2D LVIDd:         4.10 cm   Diastology LVIDs:         2.60 cm   LV e' medial:    7.51 cm/s LV PW:         1.00 cm   LV E/e' medial:  9.0 LV IVS:        1.00 cm   LV e' lateral:   7.40 cm/s LVOT diam:     2.20 cm   LV E/e' lateral: 9.2 LV SV:         149 LV SV Index:   73        2D Longitudinal Strain LVOT Area:     3.80 cm  2D Strain GLS (A2C):   -19.7 %                          2D Strain GLS (A3C):   -20.2 %                          2D Strain GLS (A4C):   -17.8 %                          2D Strain GLS Avg:     -19.3 % RIGHT VENTRICLE TAPSE (M-mode): 2.6 cm LEFT ATRIUM             Index        RIGHT ATRIUM           Index LA diam:        3.10 cm 1.53 cm/m   RA Area:     16.70 cm LA Vol (A2C):   62.2 ml 30.65 ml/m  RA Volume:   40.50 ml  19.96 ml/m LA Vol (A4C):   39.6 ml 19.51 ml/m LA Biplane Vol: 49.9 ml 24.59 ml/m  AORTIC VALVE AV Area (Vmax):    3.40 cm AV Area (Vmean):   3.15 cm AV Area (VTI):     3.48 cm AV Vmax:           188.00 cm/s AV  Vmean:          163.000 cm/s AV VTI:            0.428 m AV Peak Grad:      14.1 mmHg AV Mean Grad:      11.0 mmHg LVOT Vmax:         168.00 cm/s LVOT Vmean:        135.000 cm/s LVOT VTI:          0.392 m LVOT/AV VTI ratio: 0.92  AORTA Ao Root diam: 3.30 cm Ao Asc diam:  3.40 cm MITRAL VALVE MV Area (PHT): 2.53  cm    SHUNTS MV Decel Time: 300 msec    Systemic VTI:  0.39 m MV E velocity: 67.90 cm/s  Systemic Diam: 2.20 cm MV A velocity: 82.50 cm/s MV E/A ratio:  0.82 Armanda Magic MD Electronically signed by Armanda Magic MD Signature Date/Time: 08/24/2022/4:57:56 PM    Final    DG Chest Port 1 View  Result Date: 08/23/2022 CLINICAL DATA:  Chest pain EXAM: PORTABLE CHEST - 1 VIEW COMPARISON:  04/12/2017 FINDINGS: Lungs are clear.  No pneumothorax. Heart size and mediastinal contours are within normal limits. No effusion. Visualized bones unremarkable. IMPRESSION: No acute cardiopulmonary disease. Electronically Signed   By: Corlis Leak M.D.   On: 08/23/2022 14:00     Discharge Exam: Vitals:   08/25/22 0817 08/25/22 0846  BP:    Pulse: 61 65  Resp: 18   Temp:    SpO2:     Vitals:   08/25/22 0413 08/25/22 0800 08/25/22 0817 08/25/22 0846  BP: 104/76 127/83    Pulse: (!) 55 65 61 65  Resp: 18 16 18    Temp: 97.9 F (36.6 C) 98 F (36.7 C)    TempSrc: Oral Oral    SpO2: 96% 98%    Weight: 83.5 kg     Height:        General: Pt is alert, awake, not in acute distress Cardiovascular: RRR, S1/S2 +, no rubs, no gallops Respiratory: CTA bilaterally, no wheezing, no rhonchi Abdominal: Soft, NT, ND, bowel sounds + Extremities: no edema, no cyanosis    The results of significant diagnostics from this hospitalization (including imaging, microbiology, ancillary and laboratory) are listed below for reference.     Microbiology: No results found for this or any previous visit (from the past 240 hour(s)).   Labs: BNP (last 3 results) Recent Labs    08/23/22 1251  BNP 25.5   Basic Metabolic  Panel: Recent Labs  Lab 08/23/22 1251 08/24/22 0140 08/24/22 1003  NA 139 139 135  K 3.8 3.4* 4.6  CL 107 106 102  CO2 23 26 20*  GLUCOSE 90 98 85  BUN 9 11 11   CREATININE 0.80 1.33* 1.01  CALCIUM 8.8* 8.6* 8.9   Liver Function Tests: Recent Labs  Lab 08/24/22 0140  AST 18  ALT 18  ALKPHOS 36*  BILITOT 0.8  PROT 5.9*  ALBUMIN 3.5   No results for input(s): "LIPASE", "AMYLASE" in the last 168 hours. No results for input(s): "AMMONIA" in the last 168 hours. CBC: Recent Labs  Lab 08/23/22 1251 08/24/22 0140  WBC 5.2 4.9  HGB 13.7 13.2  HCT 40.0 40.3  MCV 92.2 93.7  PLT 194 185   Cardiac Enzymes: No results for input(s): "CKTOTAL", "CKMB", "CKMBINDEX", "TROPONINI" in the last 168 hours. BNP: Invalid input(s): "POCBNP" CBG: Recent Labs  Lab 08/24/22 0558 08/25/22 0410  GLUCAP 95 90   D-Dimer Recent Labs    08/24/22 1003  DDIMER <0.27   Hgb A1c No results for input(s): "HGBA1C" in the last 72 hours. Lipid Profile No results for input(s): "CHOL", "HDL", "LDLCALC", "TRIG", "CHOLHDL", "LDLDIRECT" in the last 72 hours. Thyroid function studies No results for input(s): "TSH", "T4TOTAL", "T3FREE", "THYROIDAB" in the last 72 hours.  Invalid input(s): "FREET3" Anemia work up No results for input(s): "VITAMINB12", "FOLATE", "FERRITIN", "TIBC", "IRON", "RETICCTPCT" in the last 72 hours. Urinalysis    Component Value Date/Time   COLORURINE YELLOW 03/30/2012 1638   APPEARANCEUR CLOUDY (A) 03/30/2012 1638   LABSPEC 1.018 03/30/2012 1638  PHURINE 8.0 03/30/2012 1638   GLUCOSEU NEGATIVE 03/30/2012 1638   HGBUR LARGE (A) 03/30/2012 1638   BILIRUBINUR NEGATIVE 03/30/2012 1638   KETONESUR 15 (A) 03/30/2012 1638   PROTEINUR 30 (A) 03/30/2012 1638   UROBILINOGEN 0.2 03/30/2012 1638   NITRITE NEGATIVE 03/30/2012 1638   LEUKOCYTESUR NEGATIVE 03/30/2012 1638   Sepsis Labs Recent Labs  Lab 08/23/22 1251 08/24/22 0140  WBC 5.2 4.9   Microbiology No results  found for this or any previous visit (from the past 240 hour(s)).  FURTHER DISCHARGE INSTRUCTIONS:   Get Medicines reviewed and adjusted: Please take all your medications with you for your next visit with your Primary MD   Laboratory/radiological data: Please request your Primary MD to go over all hospital tests and procedure/radiological results at the follow up, please ask your Primary MD to get all Hospital records sent to his/her office.   In some cases, they will be blood work, cultures and biopsy results pending at the time of your discharge. Please request that your primary care M.D. goes through all the records of your hospital data and follows up on these results.   Also Note the following: If you experience worsening of your admission symptoms, develop shortness of breath, life threatening emergency, suicidal or homicidal thoughts you must seek medical attention immediately by calling 911 or calling your MD immediately  if symptoms less severe.   You must read complete instructions/literature along with all the possible adverse reactions/side effects for all the Medicines you take and that have been prescribed to you. Take any new Medicines after you have completely understood and accpet all the possible adverse reactions/side effects.    Do not drive when taking Pain medications or sleeping medications (Benzodaizepines)   Do not take more than prescribed Pain, Sleep and Anxiety Medications. It is not advisable to combine anxiety,sleep and pain medications without talking with your primary care practitioner   Special Instructions: If you have smoked or chewed Tobacco  in the last 2 yrs please stop smoking, stop any regular Alcohol  and or any Recreational drug use.   Wear Seat belts while driving.   Please note: You were cared for by a hospitalist during your hospital stay. Once you are discharged, your primary care physician will handle any further medical issues. Please note  that NO REFILLS for any discharge medications will be authorized once you are discharged, as it is imperative that you return to your primary care physician (or establish a relationship with a primary care physician if you do not have one) for your post hospital discharge needs so that they can reassess your need for medications and monitor your lab values  Time coordinating discharge: Over 30 minutes  SIGNED:   Hughie Closs, MD  [facility] 08/25/2022, 2:13 PM *Please note that this is a verbal dictation therefore any spelling or grammatical errors are due to the "[employer] One" system interpretation. If 7PM-7AM, please contact night-coverage [email or web address]

## 2022-08-26 ENCOUNTER — Other Ambulatory Visit: Payer: Self-pay | Admitting: Cardiology

## 2022-08-26 ENCOUNTER — Telehealth: Payer: Self-pay | Admitting: Internal Medicine

## 2022-08-26 DIAGNOSIS — R931 Abnormal findings on diagnostic imaging of heart and coronary circulation: Secondary | ICD-10-CM

## 2022-08-26 DIAGNOSIS — R0609 Other forms of dyspnea: Secondary | ICD-10-CM

## 2022-08-26 NOTE — Telephone Encounter (Signed)
New Message:      Please let Kurt Walker know that this patient needs an appointment. He said Kurt Walker already know about this.

## 2022-08-26 NOTE — Telephone Encounter (Signed)
Called pt offered OV 08/31/22 with Dr. Izora Ribas for HCM consult. Provided pt with office address.  All questions answered.

## 2022-08-29 NOTE — Progress Notes (Signed)
Discussed with the patient. MRI is not consistent with HCM. Recommend 2 week Zio patch and exercise nucelar stress test, given his exertional dyspnea and elevated coronary calcium score.  Elder Negus, MD

## 2022-08-30 NOTE — Progress Notes (Unsigned)
Cardiology Office Note:  .    Date:  08/31/2022  ID:  [name], DOB 1965-06-18, MRN [id number] PCP: Daisy Floro, MD  [facility] Providers Cardiologist:  None     CC: Syncope Consulted for the evaluation of Syncope and potential HCM at the behest of Dr. Tenny Craw  History of Present Illness: .    Kurt Walker is a 57 y.o. male with a history of HTN.  He has a syncopal episode and collapsed. Seen by [place] group: found to have an color flow LVOT gradient.  CMR was performed; in my review, Basal sepal measured 15 mm.  RV was found to be low normal.  Patient notes that he was doing well until last 2023. He started to have DOE with exertion.  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  He has had waxing of waning in his symptoms.  With this he had waxing and waning of his symptoms. Worse after large meals. Symptoms have been worse this summer. He is a bit worried that his symptoms are all in his head.  Notes no fatigue. Notes  palpitations that worsen with exercise and improve with rest. Notes no CP. Notes  Dizziness and near syncope; his recent hospitalization was for this indication.. Notes no true syncope.  He was recently started on metoprolol with improvement in symptoms.  Notable family events include  Mother had early CAD in the setting of chronic smoking; she has an autopsy proven MI at age 40. Sister has TIA and strokes without HCM or AF. One son had had omphalocele and is no longer with [place]. Has a 5, 19, and twin 56 year old boys. No FHX of SCD or HCM.  Relevant histories: .  Social- married, works at [employer].  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 ROS: As per HPI.   Studies Reviewed: .   Cardiac Studies & Procedures       ECHOCARDIOGRAM  ECHOCARDIOGRAM COMPLETE 08/24/2022  Narrative ECHOCARDIOGRAM REPORT    Patient Name:   Kurt Walker Date of Exam: 08/24/2022 Medical Rec #:  [id number]      Height:        71.0 in Accession #:    [id number]     Weight:       182.8 lb Date of Birth:  04-Sep-1965     BSA:          2.029 m Patient Age:    56 years       BP:           121/80 mmHg Patient Gender: M              HR:           57 bpm. Exam Location:  Inpatient  Procedure: 2D Echo, Color Doppler, Cardiac Doppler and Intracardiac Opacification Agent  Indications:    R55 Syncope  History:        Patient has prior history of Echocardiogram examinations, most recent 09/03/2019. Risk Factors:Hypertension and Dyslipidemia.  Sonographer:    Irving Burton Senior RDCS Referring Phys: Cecille Po MELVIN  IMPRESSIONS   1. Moderate basal septal hypertrophy with turbulent flow in the LVOT and SAM with peak LVOT gradient of with Valsalva. Findings suggestive of HOCM. Marland Kitchen Left ventricular ejection fraction, by estimation, is 65 to 70%. The left ventricle has normal function. The left ventricle has no regional wall motion abnormalities. There is moderate left ventricular hypertrophy of  the basal-septal segment. Left ventricular diastolic parameters are indeterminate. The average left ventricular global longitudinal strain is -19.3 %. The global longitudinal strain is normal. 2. Right ventricular systolic function is normal. The right ventricular size is normal. 3. The mitral valve is normal in structure. Trivial mitral valve regurgitation. No evidence of mitral stenosis. 4. The aortic valve is tricuspid. Aortic valve regurgitation is not visualized. Aortic valve sclerosis/calcification is present, without any evidence of aortic stenosis. Aortic valve area, by VTI measures 3.48 cm. Aortic valve mean gradient measures 11.0 mmHg. Aortic valve Vmax measures 1.88 m/s. 5. The inferior vena cava is normal in size with greater than 50% respiratory variability, suggesting right atrial pressure of 3 mmHg. 6. Consider cardiac MRI to evaluate for HOCM.  FINDINGS Left Ventricle: Moderate basal septal hypertrophy with  turbulent flow in the LVOT and SAM with peak LVOT gradient of with Valsalva. Findings suggestive of HOCM.Consider cardiac MRI to rule out HOCM. Left ventricular ejection fraction, by estimation, is 65 to 70%. The left ventricle has normal function. The left ventricle has no regional wall motion abnormalities. Definity contrast agent was given IV to delineate the left ventricular endocardial borders. The average left ventricular global longitudinal strain is -19.3 %. The global longitudinal strain is normal. The left ventricular internal cavity size was normal in size. There is moderate left ventricular hypertrophy of the basal-septal segment. Left ventricular diastolic parameters are indeterminate. Normal left ventricular filling pressure.  Right Ventricle: The right ventricular size is normal. No increase in right ventricular wall thickness. Right ventricular systolic function is normal.  Left Atrium: Left atrial size was normal in size.  Right Atrium: Right atrial size was normal in size.  Pericardium: There is no evidence of pericardial effusion.  Mitral Valve: The mitral valve is normal in structure. Trivial mitral valve regurgitation. No evidence of mitral valve stenosis.  Tricuspid Valve: The tricuspid valve is normal in structure. Tricuspid valve regurgitation is not demonstrated. No evidence of tricuspid stenosis.  Aortic Valve: The aortic valve is tricuspid. Aortic valve regurgitation is not visualized. Aortic valve sclerosis/calcification is present, without any evidence of aortic stenosis. Aortic valve mean gradient measures 11.0 mmHg. Aortic valve peak gradient measures 14.1 mmHg. Aortic valve area, by VTI measures 3.48 cm.  Pulmonic Valve: The pulmonic valve was normal in structure. Pulmonic valve regurgitation is trivial. No evidence of pulmonic stenosis.  Aorta: The aortic root is normal in size and structure.  Venous: The inferior vena cava is normal in size with  greater than 50% respiratory variability, suggesting right atrial pressure of 3 mmHg.  IAS/Shunts: No atrial level shunt detected by color flow Doppler.   LEFT VENTRICLE PLAX 2D LVIDd:         4.10 cm   Diastology LVIDs:         2.60 cm   LV e' medial:    7.51 cm/s LV PW:         1.00 cm   LV E/e' medial:  9.0 LV IVS:        1.00 cm   LV e' lateral:   7.40 cm/s LVOT diam:     2.20 cm   LV E/e' lateral: 9.2 LV SV:         149 LV SV Index:   73        2D Longitudinal Strain LVOT Area:     3.80 cm  2D Strain GLS (A2C):   -19.7 % 2D Strain GLS (A3C):   -20.2 %  2D Strain GLS (A4C):   -17.8 % 2D Strain GLS Avg:     -19.3 %  RIGHT VENTRICLE TAPSE (M-mode): 2.6 cm  LEFT ATRIUM             Index        RIGHT ATRIUM           Index LA diam:        3.10 cm 1.53 cm/m   RA Area:     16.70 cm LA Vol (A2C):   62.2 ml 30.65 ml/m  RA Volume:   40.50 ml  19.96 ml/m LA Vol (A4C):   39.6 ml 19.51 ml/m LA Biplane Vol: 49.9 ml 24.59 ml/m AORTIC VALVE AV Area (Vmax):    3.40 cm AV Area (Vmean):   3.15 cm AV Area (VTI):     3.48 cm AV Vmax:           188.00 cm/s AV Vmean:          163.000 cm/s AV VTI:            0.428 m AV Peak Grad:      14.1 mmHg AV Mean Grad:      11.0 mmHg LVOT Vmax:         168.00 cm/s LVOT Vmean:        135.000 cm/s LVOT VTI:          0.392 m LVOT/AV VTI ratio: 0.92  AORTA Ao Root diam: 3.30 cm Ao Asc diam:  3.40 cm  MITRAL VALVE MV Area (PHT): 2.53 cm    SHUNTS MV Decel Time: 300 msec    Systemic VTI:  0.39 m MV E velocity: 67.90 cm/s  Systemic Diam: 2.20 cm MV A velocity: 82.50 cm/s MV E/A ratio:  0.82  Armanda Magic MD Electronically signed by Armanda Magic MD Signature Date/Time: 08/24/2022/4:57:56 PM    Final      CARDIAC MRI  MR CARDIAC MORPHOLOGY W WO CONTRAST 08/25/2022  Narrative CLINICAL DATA:  67M presents with presyncope  EXAM: CARDIAC MRI  TECHNIQUE: The patient was scanned on a 1.5 Tesla Siemens magnet. A dedicated cardiac coil  was used. Functional imaging was done using Fiesta sequences. 2,3, and 4 chamber views were done to assess for RWMA's. Modified Simpson's rule using a short axis stack was used to calculate an ejection fraction on a dedicated work Research officer, trade [employer]. The patient received 8 cc of Gadavist. After 10 minutes inversion recovery sequences were used to assess for infiltration and scar tissue. Phase contrast velocity mapping was performed above the aortic and pulmonic valves  CONTRAST:  8 cc  of Gadavist  FINDINGS: Left ventricle:  -Asymmetric hypertrophy, measuring up to 13mm in basal septum (10mm in posterior wall), not meeting criteria for hypertrophic cardiomyopathy (<31mm)  -Normal size  -Normal systolic function  -Mild ECV elevation (29%)  -RV insertion site LGE  LV EF: 64% (Normal 49-79%)  Absolute volumes:  LV EDV: (Normal 95-215 mL)  LV ESV: 45mL (Normal 25-85 mL)  LV SV: 79mL (Normal 61-145 mL)  CO: 4.7L/min (Normal 3.4-7.8 L/min)  Indexed volumes:  LV EDV: 1mL/sq-m (Normal 50-108 mL/sq-m)  LV ESV: 41mL/sq-m (Normal 11-47 mL/sq-m)  LV SV: 35mL/sq-m (Normal 33-72 mL/sq-m)  CI: 2.3L/min/sq-m (Normal 1.8-4.2 L/min/sq-m)  Right ventricle: Normal size with mild systolic dysfunction  RV EF:  65% (Normal 51-80%)  Absolute volumes:  RV EDV: (Normal 109-217 mL)  RV ESV: 64mL (Normal 23-91 mL)  RV SV: 53mL (Normal 71-141  mL)  CO: 3.2L/min (Normal 2.8-8.8 L/min)  Indexed volumes:  RV EDV: 57mL/sq-m (Normal 58-109 mL/sq-m)  RV ESV: 59mL/sq-m (Normal 12-46 mL/sq-m)  RV SV: 55mL/sq-m (Normal 38-71 mL/sq-m)  CI: 1.6L/min/sq-m (Normal 1.7-4.2 L/min/sq-m)  Left atrium: Normal size  Right atrium: Normal size  Mitral valve: Mild regurgitation (regurgitant fraction 11%)  Aortic valve: Trivial regurgitation.  Tricuspid  Tricuspid valve: Trivial regurgitation  Pulmonic valve: Trivial regurgitation  Aorta: Normal proximal  ascending aorta  Pericardium: Normal  IMPRESSION: 1. Asymmetric LV hypertrophy, measuring up to 13mm in basal septum (10mm in posterior wall), not meeting criteria for hypertrophic cardiomyopathy (<64mm)  2. RV insertion site late gadolinium enhancement, which is a nonspecific findings often seen in setting of elevated pulmonary pressures  3.  Normal LV size and systolic function (EF 64%)  4.  Normal RV size with mild systolic dysfunction (EF 45%)  5.  Mild mitral regurgitation (regurgitant fraction 11%)   Electronically Signed By: Epifanio Lesches M.D. On: 08/25/2022 16:21          Physical Exam:    VS:  BP 120/80   Pulse 64   Ht 5\' 11"  (1.803 m)   Wt 188 lb 12.8 oz (85.6 kg)   SpO2 98%   BMI 26.33 kg/m    Wt Readings from Last 3 Encounters:  08/31/22 188 lb 12.8 oz (85.6 kg)  08/25/22 184 lb (83.5 kg)  02/17/22 190 lb (86.2 kg)    Gen: no distress, Neck: No JVD Ears: no Homero Fellers Sign Cardiac: No Rubs or Gallops, harsh systolic murmur worse with handgrip, Valsalva, and standing Respiratory: Clear to auscultation bilaterally, normal effort, normal  respiratory rate GI: Soft, nontender, non-distended  MS: No  edema;  moves all extremities Integument: Skin feels warm Neuro:  At time of evaluation, alert and oriented to person/place/time/situation  Psych: Normal affect, patient feels nervous   ASSESSMENT AND PLAN: .    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 Near Syncope  - basal septal thickness of 15 mm on Cardiac MRI on my review; reviewed with inpatient reader - peak gradient 43 on supine assessment  - no Apical Aneurysm or high risk findings - suspicion of Fabry's/Danon or other mimics of HCM: none - Gene variant: Pending  - NYHA II - Non HCM Contributors to disease/status  - CAC/HLD and family history of CAD; symptoms are more consistent with HCM than angina; if worsening or if chest pain will perform CCTA  for assessment   Family history reviewed, Discussed family screening  (Would recommend screening of 56 yo, and 4, yo; genetics referral placed)  SCD  Assessment - CMR from 2024 notable for no scar  - will get ziopatch for palpitations - SCD risk estimated to be low but with incomplete work upwill get stress test  Atrial fibrillation not found   Medication symptom plan - will start with hydration and metoprolol - if fatigue on BB wil trial diltiazem - will get stress echo for LVOT gradient  Will see in December (may need overbook)  Time Spent Directly with Patient:   I have spent a total of 65 minutes with the patient reviewing notes, imaging, EKGs, labs and examining the patient as well as establishing an assessment and plan that was discussed personally with the patient.  > 50% of time was spent in direct patient care and reviewing imaging with patient.    Riley Lam, MD FASE [facility] Cardiologist [facility]  [facility]  [address] [place], [place]  [place], [place] [address] [phone]  10:57 AM

## 2022-08-31 ENCOUNTER — Encounter: Payer: Self-pay | Admitting: Internal Medicine

## 2022-08-31 ENCOUNTER — Ambulatory Visit (INDEPENDENT_AMBULATORY_CARE_PROVIDER_SITE_OTHER): Payer: BC Managed Care – PPO

## 2022-08-31 ENCOUNTER — Ambulatory Visit: Payer: BC Managed Care – PPO | Attending: Internal Medicine | Admitting: Internal Medicine

## 2022-08-31 VITALS — BP 120/80 | HR 64 | Ht 71.0 in | Wt 188.8 lb

## 2022-08-31 DIAGNOSIS — R002 Palpitations: Secondary | ICD-10-CM

## 2022-08-31 DIAGNOSIS — I340 Nonrheumatic mitral (valve) insufficiency: Secondary | ICD-10-CM | POA: Insufficient documentation

## 2022-08-31 DIAGNOSIS — I422 Other hypertrophic cardiomyopathy: Secondary | ICD-10-CM | POA: Diagnosis not present

## 2022-08-31 DIAGNOSIS — I517 Cardiomegaly: Secondary | ICD-10-CM

## 2022-08-31 NOTE — Progress Notes (Unsigned)
Enrolled patient for a 7 day Zio XT monitor to be mailed to patients home.  

## 2022-08-31 NOTE — Patient Instructions (Signed)
Medication Instructions:  Your physician recommends that you continue on your current medications as directed. Please refer to the Current Medication list given to you today.  *If you need a refill on your cardiac medications before your next appointment, please call your pharmacy*   Lab Work: NONE If you have labs (blood work) drawn today and your tests are completely normal, you will receive your results only by: MyChart Message (if you have MyChart) OR A paper copy in the mail If you have any lab test that is abnormal or we need to change your treatment, we will call you to review the results.   Testing/Procedures: Your physician has requested that you have a stress echocardiogram. For further information please visit [email or web address] Please follow instruction sheet as given.   Your physician has requested that you wear a heart monitor.    Follow-Up: At [facility], you and your health needs are our priority.  As part of our continuing mission to provide you with exceptional heart care, we have created designated Provider Care Teams.  These Care Teams include your primary Cardiologist (physician) and Advanced Practice Providers (APPs -  Physician Assistants and Nurse Practitioners) who all work together to provide you with the care you need, when you need it.  We recommend signing up for the patient portal called "MyChart".  Sign up information is provided on this After Visit Summary.  MyChart is used to connect with patients for Virtual Visits (Telemedicine).  Patients are able to view lab/test results, encounter notes, upcoming appointments, etc.  Non-urgent messages can be sent to your provider as well.   To learn more about what you can do with MyChart, go to [email or web address].    Your next appointment:   4 month(s)  Provider:   Riley Lam, MD  Other Instructions Christena Deem- Long Term Monitor Instructions  Your physician has requested you wear a ZIO  patch monitor for 7 days.  This is a single patch monitor. [employer] supplies one patch monitor per enrollment. Additional stickers are not available. Please do not apply patch if you will be having a Nuclear Stress Test,  Cardiac CT, MRI, or Chest Xray during the period you would be wearing the  monitor. The patch cannot be worn during these tests. You cannot remove and re-apply the  ZIO XT patch monitor.  Your ZIO patch monitor will be mailed 3 day USPS to your address on file. It may take 3-5 days  to receive your monitor after you have been enrolled.  Once you have received your monitor, please review the enclosed instructions. Your monitor  has already been registered assigning a specific monitor serial # to you.  Billing and Patient Assistance Program Information  We have supplied [employer] with any of your insurance information on file for billing purposes. [employer] offers a sliding scale Patient Assistance Program for patients that do not have  insurance, or whose insurance does not completely cover the cost of the ZIO monitor.  You must apply for the Patient Assistance Program to qualify for this discounted rate.  To apply, please call [employer] at [phone], select option 4, select option 2, ask to apply for  Patient Assistance Program. Meredeth Ide will ask your household income, and how many people  are in your household. They will quote your out-of-pocket cost based on that information.  [employer] will also be able to set up a 32-month, interest-free payment plan if needed.  Applying the monitor   Shave hair from upper left  chest.  Hold abrader disc by orange tab. Rub abrader in 40 strokes over the upper left chest as  indicated in your monitor instructions.  Clean area with 4 enclosed alcohol pads. Let dry.  Apply patch as indicated in monitor instructions. Patch will be placed under collarbone on left  side of chest with arrow pointing upward.  Rub patch adhesive wings for 2  minutes. Remove white label marked "1". Remove the white  label marked "2". Rub patch adhesive wings for 2 additional minutes.  While looking in a mirror, press and release button in center of patch. A small green light will  flash 3-4 times. This will be your only indicator that the monitor has been turned on.  Do not shower for the first 24 hours. You may shower after the first 24 hours.  Press the button if you feel a symptom. You will hear a small click. Record Date, Time and  Symptom in the Patient Logbook.  When you are ready to remove the patch, follow instructions on the last 2 pages of Patient  Logbook. Stick patch monitor onto the last page of Patient Logbook.  Place Patient Logbook in the blue and white box. Use locking tab on box and tape box closed  securely. The blue and white box has prepaid postage on it. Please place it in the mailbox as  soon as possible. Your physician should have your test results approximately 7 days after the  monitor has been mailed back to [facility].  Call [facility] Customer Care at [phone] if you have questions regarding  your ZIO XT patch monitor. Call them immediately if you see an orange light blinking on your  monitor.  If your monitor falls off in less than 4 days, contact our Monitor department at [phone].  If your monitor becomes loose or falls off after 4 days call [employer] at [phone] for  suggestions on securing your monitor

## 2022-09-01 ENCOUNTER — Other Ambulatory Visit: Payer: BC Managed Care – PPO

## 2022-09-04 DIAGNOSIS — I517 Cardiomegaly: Secondary | ICD-10-CM

## 2022-09-04 DIAGNOSIS — R002 Palpitations: Secondary | ICD-10-CM

## 2022-09-06 ENCOUNTER — Ambulatory Visit: Payer: BC Managed Care – PPO | Admitting: Cardiology

## 2022-09-16 ENCOUNTER — Other Ambulatory Visit: Payer: Self-pay | Admitting: Cardiology

## 2022-09-23 ENCOUNTER — Ambulatory Visit (HOSPITAL_COMMUNITY): Payer: BC Managed Care – PPO | Attending: Internal Medicine

## 2022-09-23 ENCOUNTER — Ambulatory Visit (HOSPITAL_COMMUNITY): Payer: BC Managed Care – PPO

## 2022-09-23 DIAGNOSIS — R002 Palpitations: Secondary | ICD-10-CM | POA: Insufficient documentation

## 2022-09-23 LAB — ECHOCARDIOGRAM STRESS TEST
Area-P 1/2: 3.32 cm2
S' Lateral: 2.6 cm

## 2022-09-26 ENCOUNTER — Telehealth: Payer: Self-pay | Admitting: Internal Medicine

## 2022-09-26 NOTE — Telephone Encounter (Signed)
Patient states he's having chest pain, starting 45 minutes ago while sitting at his desk working. Started as  "twinge" on the L side of chest, and has gotten a little worse, states its coming and going in nature. Further describes it as a sharp pain that's in upper left breast. States he's missed his Metoprolol dose for several days now, started it back this morning. Denies radiation of pain to anywhere else, does note that back feels "a little tight." Endorses shortness of breath at rest, no diaphoresis, no nausea or vomiting. Has not eaten anything. No dizziness or lightheadedness. Palpated pulse while on phone, reports 80bpm. Questioned whether he had taken his omeprazole today and he states no. Advised that what he's describing doesn't sound like typical cardiac pain. Recommended that he take omeprazole, or at very least some tums, and eat something, then call back if not feeling better or if nature of pain changes. Pt verbalized understanding.

## 2022-09-26 NOTE — Telephone Encounter (Signed)
   Pt c/o of Chest Pain: STAT if active CP, including tightness, pressure, jaw pain, radiating pain to shoulder/upper arm/back, CP unrelieved by Nitro. Symptoms reported of SOB, nausea, vomiting, sweating.  1. Are you having CP right now? Yes (left side)   2. Are you experiencing any other symptoms (ex. SOB, nausea, vomiting, sweating)? SOB   3. Is your CP continuous or coming and going? Continuous   4. Have you taken Nitroglycerin? No   5. How long have you been experiencing CP? 45 mins    6. If NO CP at time of call then end call with telling Pt to call back or call 911 if Chest pain returns prior to return call from triage team.

## 2022-09-28 ENCOUNTER — Encounter: Payer: Self-pay | Admitting: Internal Medicine

## 2022-10-20 ENCOUNTER — Other Ambulatory Visit: Payer: Self-pay | Admitting: Cardiology

## 2022-12-26 ENCOUNTER — Ambulatory Visit: Payer: BC Managed Care – PPO | Admitting: Cardiology

## 2022-12-28 ENCOUNTER — Ambulatory Visit: Payer: BC Managed Care – PPO | Admitting: Internal Medicine

## 2023-01-02 ENCOUNTER — Ambulatory Visit: Payer: BC Managed Care – PPO | Attending: Internal Medicine | Admitting: Internal Medicine

## 2023-01-02 ENCOUNTER — Encounter: Payer: Self-pay | Admitting: Internal Medicine

## 2023-01-02 VITALS — BP 120/84 | HR 62 | Resp 16 | Ht 71.0 in | Wt 191.6 lb

## 2023-01-02 DIAGNOSIS — I422 Other hypertrophic cardiomyopathy: Secondary | ICD-10-CM

## 2023-01-02 DIAGNOSIS — E785 Hyperlipidemia, unspecified: Secondary | ICD-10-CM | POA: Diagnosis not present

## 2023-01-02 DIAGNOSIS — I700 Atherosclerosis of aorta: Secondary | ICD-10-CM

## 2023-01-02 NOTE — Progress Notes (Signed)
Cardiology Office Note:  .    Date:  01/02/2023  ID:  Kurt Walker, DOB December 16, 1965, MRN [id number] PCP: Daisy Floro, MD  [facility] Providers Cardiologist:  None     CC: oHCM follow up  History of Present Illness: .    Kurt Walker is a 57 y.o. male with a history of HTN.  He has a syncopal episode and collapsed. Seen by [place] group: found to have an color flow LVOT gradient.  CMR was performed; in my review, Basal sepal measured 15 mm.  RV was found to be low normal. 2024: found to have an inducible obstruction  Mr. Schlotter, diagnosed with obstructive hypertrophic cardiomyopathy, presents for a follow-up consultation after initiating treatment with metoprolol. The primary concern was the patient's ability to perform daily activities without experiencing symptoms such as lightheadedness, dizziness, and shortness of breath, particularly when climbing stairs.  Since starting metoprolol, the patient reports feeling "good" and "tired," attributing the fatigue to personal circumstances rather than the medication. The patient denies experiencing any more episodes of lightheadedness or dizziness while climbing stairs, a significant improvement from the previous state. However, the patient acknowledges being out of shape due to a lack of exercise since July, which may contribute to the residual shortness of breath.  The patient also reports a noticeable difference after starting metoprolol, describing a sensation of everything, including cognitive processes, slowing down. Despite this, the patient feels more energetic and overall well.  The patient has a history of high cholesterol, which was slightly above the desired range in July. Since then, no significant lifestyle changes have been made that would likely improve the cholesterol levels. The patient also has a history of hypertension, for which he was on medication for several years. However, the patient has been off the  blood pressure medication, and the blood pressure remains controlled, possibly due to the beta-blocker's effects.  The patient has a keen interest in resuming exercise, particularly running or biking, and is concerned about the impact of the obstructive hypertrophic cardiomyopathy on his ability to exercise. The patient also expresses concern about a leaky valve, which is likely secondary to the hypertrophic cardiomyopathy.  The patient has a family, including twin six-year-olds and a 56 year old, and is interested in genetic testing to determine the risk of hypertrophic cardiomyopathy in his children. The patient is also considering lifestyle modifications, including adopting a Mediterranean diet, to manage his health better.  Notable family events include  Mother had early CAD in the setting of chronic smoking; she has an autopsy proven MI at age 31. Sister has TIA and strokes without HCM or AF. One son had had omphalocele and is no longer with [place]. Has a 35, 57, and twin 56 year old boys.- all but the 56 year old have been screened. No FHX of SCD or HCM.  Relevant histories: .  Social- married, works at [employer].  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 ROS: As per HPI.   Studies Reviewed: .   Cardiac Studies & Procedures     STRESS TESTS  ECHOCARDIOGRAM STRESS TEST 09/23/2022  Narrative EXERCISE STRESS ECHO REPORT   --------------------------------------------------------------------------------  Patient Name:   Kurt Walker Date of Exam: 09/23/2022 Medical Rec #:  [id number]      Height:       71.0 in Accession #:    [id number]     Weight:       188.8 lb Date of Birth:  [birth year]  BSA:          2.058 m Patient Age:    56 years       BP:           138/92 mmHg Patient Gender: M              HR:           54 bpm. Exam Location:  [address]  Procedure: Stress Echo, Limited Echo, Limited Color Doppler and Cardiac Doppler  Indications:    HOCM  History:        Patient has  prior history of Echocardiogram examinations, most recent 08/24/2022.  Sonographer:    Thurman Coyer RDCS Referring Phys: [id number] [facility] [place]  IMPRESSIONS   1. The findings of the study demonstrate that a stress-induced outflow tract obstruction is present (peak LVOT gradient of at least 61 mmHG). There was a maximum systolic gradient of but cannot be sure if this gradient is contaminated with mitral regurgitation. 2. At baseline, trivial mitral regurgitation was present but not visualized on post stress images. 3. This is an inconclusive stress echocardiogram for ischemia. Non-diagnostic study for ischemia due to Stress test done for HOCM not for ischemia. 4. This is an indeterminate risk study.  FINDINGS  Exam Protocol: The patient exercised on a treadmill according to a Bruce protocol.   Patient Performance: The patient exercised for 9 minutes achieving 10.1 METS. The maximum stage achieved was III of the Bruce protocol. The heart rate at peak stress was 160 bpm. The target heart rate was calculated to be 140 bpm. The percentage of maximum predicted heart rate achieved was 97.4 %. The baseline blood pressure was 138/92 mmHg. The blood pressure at peak stress was 216/81 mmHg. The blood pressure response was hypertensive. The patient developed shortness of breath and fatigue during the stress exam.  EKG: Resting EKG showed normal sinus rhythm with no abnormal findings. The patient developed Nonspecific ST abnormality during stress during exercise.   2D Echo Findings: The baseline ejection fraction was 60%. The peak ejection fraction at stress was 80%. Baseline regional wall motion abnormalities were not present. This is an inconclusive stress echocardiogram for ischemia. This is a non-diagnostic study for ischemia due to Stress test done for HOCM not for ischemia.  Stress Doppler:  LVOT: The baseline LVOT gradient was 8 mmHG. The peak LVOT gradient at stress was  90 mmHG. The findings of the study demonstrate that a stress-induced outflow tract obstruction is present.  Mitral Regurgitation: At baseline, trivial mitral regurgitation was present.   Armanda Magic MD Electronically signed on 09/23/2022 at 4:27:59 PM     Final  ECHOCARDIOGRAM  ECHOCARDIOGRAM COMPLETE 08/24/2022  Narrative ECHOCARDIOGRAM REPORT    Patient Name:   NAVIAN AGUILA Date of Exam: 08/24/2022 Medical Rec #:  [id number]      Height:       71.0 in Accession #:    [id number]     Weight:       182.8 lb Date of Birth:  01-Jun-1965     BSA:          2.029 m Patient Age:    56 years       BP:           121/80 mmHg Patient Gender: M              HR:           57 bpm. Exam Location:  Inpatient  Procedure:  2D Echo, Color Doppler, Cardiac Doppler and Intracardiac Opacification Agent  Indications:    R55 Syncope  History:        Patient has prior history of Echocardiogram examinations, most recent 09/03/2019. Risk Factors:Hypertension and Dyslipidemia.  Sonographer:    Irving Burton Senior RDCS Referring Phys: Cecille Po MELVIN  IMPRESSIONS   1. Moderate basal septal hypertrophy with turbulent flow in the LVOT and SAM with peak LVOT gradient of with Valsalva. Findings suggestive of HOCM. Marland Kitchen Left ventricular ejection fraction, by estimation, is 65 to 70%. The left ventricle has normal function. The left ventricle has no regional wall motion abnormalities. There is moderate left ventricular hypertrophy of the basal-septal segment. Left ventricular diastolic parameters are indeterminate. The average left ventricular global longitudinal strain is -19.3 %. The global longitudinal strain is normal. 2. Right ventricular systolic function is normal. The right ventricular size is normal. 3. The mitral valve is normal in structure. Trivial mitral valve regurgitation. No evidence of mitral stenosis. 4. The aortic valve is tricuspid. Aortic valve regurgitation is not visualized. Aortic  valve sclerosis/calcification is present, without any evidence of aortic stenosis. Aortic valve area, by VTI measures 3.48 cm. Aortic valve mean gradient measures 11.0 mmHg. Aortic valve Vmax measures 1.88 m/s. 5. The inferior vena cava is normal in size with greater than 50% respiratory variability, suggesting right atrial pressure of 3 mmHg. 6. Consider cardiac MRI to evaluate for HOCM.  FINDINGS Left Ventricle: Moderate basal septal hypertrophy with turbulent flow in the LVOT and SAM with peak LVOT gradient of with Valsalva. Findings suggestive of HOCM.Consider cardiac MRI to rule out HOCM. Left ventricular ejection fraction, by estimation, is 65 to 70%. The left ventricle has normal function. The left ventricle has no regional wall motion abnormalities. Definity contrast agent was given IV to delineate the left ventricular endocardial borders. The average left ventricular global longitudinal strain is -19.3 %. The global longitudinal strain is normal. The left ventricular internal cavity size was normal in size. There is moderate left ventricular hypertrophy of the basal-septal segment. Left ventricular diastolic parameters are indeterminate. Normal left ventricular filling pressure.  Right Ventricle: The right ventricular size is normal. No increase in right ventricular wall thickness. Right ventricular systolic function is normal.  Left Atrium: Left atrial size was normal in size.  Right Atrium: Right atrial size was normal in size.  Pericardium: There is no evidence of pericardial effusion.  Mitral Valve: The mitral valve is normal in structure. Trivial mitral valve regurgitation. No evidence of mitral valve stenosis.  Tricuspid Valve: The tricuspid valve is normal in structure. Tricuspid valve regurgitation is not demonstrated. No evidence of tricuspid stenosis.  Aortic Valve: The aortic valve is tricuspid. Aortic valve regurgitation is not visualized. Aortic valve  sclerosis/calcification is present, without any evidence of aortic stenosis. Aortic valve mean gradient measures 11.0 mmHg. Aortic valve peak gradient measures 14.1 mmHg. Aortic valve area, by VTI measures 3.48 cm.  Pulmonic Valve: The pulmonic valve was normal in structure. Pulmonic valve regurgitation is trivial. No evidence of pulmonic stenosis.  Aorta: The aortic root is normal in size and structure.  Venous: The inferior vena cava is normal in size with greater than 50% respiratory variability, suggesting right atrial pressure of 3 mmHg.  IAS/Shunts: No atrial level shunt detected by color flow Doppler.   LEFT VENTRICLE PLAX 2D LVIDd:         4.10 cm   Diastology LVIDs:         2.60 cm  LV e' medial:    7.51 cm/s LV PW:         1.00 cm   LV E/e' medial:  9.0 LV IVS:        1.00 cm   LV e' lateral:   7.40 cm/s LVOT diam:     2.20 cm   LV E/e' lateral: 9.2 LV SV:         149 LV SV Index:   73        2D Longitudinal Strain LVOT Area:     3.80 cm  2D Strain GLS (A2C):   -19.7 % 2D Strain GLS (A3C):   -20.2 % 2D Strain GLS (A4C):   -17.8 % 2D Strain GLS Avg:     -19.3 %  RIGHT VENTRICLE TAPSE (M-mode): 2.6 cm  LEFT ATRIUM             Index        RIGHT ATRIUM           Index LA diam:        3.10 cm 1.53 cm/m   RA Area:     16.70 cm LA Vol (A2C):   62.2 ml 30.65 ml/m  RA Volume:   40.50 ml  19.96 ml/m LA Vol (A4C):   39.6 ml 19.51 ml/m LA Biplane Vol: 49.9 ml 24.59 ml/m AORTIC VALVE AV Area (Vmax):    3.40 cm AV Area (Vmean):   3.15 cm AV Area (VTI):     3.48 cm AV Vmax:           188.00 cm/s AV Vmean:          163.000 cm/s AV VTI:            0.428 m AV Peak Grad:      14.1 mmHg AV Mean Grad:      11.0 mmHg LVOT Vmax:         168.00 cm/s LVOT Vmean:        135.000 cm/s LVOT VTI:          0.392 m LVOT/AV VTI ratio: 0.92  AORTA Ao Root diam: 3.30 cm Ao Asc diam:  3.40 cm  MITRAL VALVE MV Area (PHT): 2.53 cm    SHUNTS MV Decel Time: 300 msec    Systemic  VTI:  0.39 m MV E velocity: 67.90 cm/s  Systemic Diam: 2.20 cm MV A velocity: 82.50 cm/s MV E/A ratio:  0.82  Gloris Manchester Turner MD Electronically signed by Armanda Magic MD Signature Date/Time: 08/24/2022/4:57:56 PM    Final   MONITORS  LONG TERM MONITOR (3-14 DAYS) 09/15/2022  Narrative   Patient had a minimum heart rate of 43 bpm, maximum heart rate of 156 bpm, and average heart rate of 63 bpm.   Predominant underlying rhythm was Sinus rhythm.   Two short, 4 beat runs of SVT.   One run of NSVT 156 bpm (4 beats).   A NSVT run of 9.5 seconds is 115 bpm.  This QRS may represent artifact.   Isolated PACs were rare (<1.0%).   Isolated PVCs were rare (<1.0%).   Triggered and diary events associated with sinus bradycardia.  Very rare NSVT and possible artifact.   CARDIAC MRI  MR CARDIAC MORPHOLOGY W WO CONTRAST 08/25/2022  Narrative CLINICAL DATA:  61M presents with presyncope  EXAM: CARDIAC MRI  TECHNIQUE: The patient was scanned on a 1.5 Tesla Siemens magnet. A dedicated cardiac coil was used. Functional imaging was done using Fiesta sequences. 2,3, and  4 chamber views were done to assess for RWMA's. Modified Simpson's rule using a short axis stack was used to calculate an ejection fraction on a dedicated work [employer]. The patient received 8 cc of Gadavist. After 10 minutes inversion recovery sequences were used to assess for infiltration and scar tissue. Phase contrast velocity mapping was performed above the aortic and pulmonic valves  CONTRAST:  8 cc  of Gadavist  FINDINGS: Left ventricle:  -Asymmetric hypertrophy, measuring up to 13mm in basal septum (10mm in posterior wall), not meeting criteria for hypertrophic cardiomyopathy (<79mm)  -Normal size  -Normal systolic function  -Mild ECV elevation (29%)  -RV insertion site LGE  LV EF: 64% (Normal 49-79%)  Absolute volumes:  LV EDV: (Normal 95-215 mL)  LV ESV: 45mL (Normal 25-85  mL)  LV SV: 79mL (Normal 61-145 mL)  CO: 4.7L/min (Normal 3.4-7.8 L/min)  Indexed volumes:  LV EDV: 69mL/sq-m (Normal 50-108 mL/sq-m)  LV ESV: 36mL/sq-m (Normal 11-47 mL/sq-m)  LV SV: 22mL/sq-m (Normal 33-72 mL/sq-m)  CI: 2.3L/min/sq-m (Normal 1.8-4.2 L/min/sq-m)  Right ventricle: Normal size with mild systolic dysfunction  RV EF:  56% (Normal 51-80%)  Absolute volumes:  RV EDV: (Normal 109-217 mL)  RV ESV: 64mL (Normal 23-91 mL)  RV SV: 53mL (Normal 71-141 mL)  CO: 3.2L/min (Normal 2.8-8.8 L/min)  Indexed volumes:  RV EDV: 91mL/sq-m (Normal 58-109 mL/sq-m)  RV ESV: 35mL/sq-m (Normal 12-46 mL/sq-m)  RV SV: 44mL/sq-m (Normal 38-71 mL/sq-m)  CI: 1.6L/min/sq-m (Normal 1.7-4.2 L/min/sq-m)  Left atrium: Normal size  Right atrium: Normal size  Mitral valve: Mild regurgitation (regurgitant fraction 11%)  Aortic valve: Trivial regurgitation.  Tricuspid  Tricuspid valve: Trivial regurgitation  Pulmonic valve: Trivial regurgitation  Aorta: Normal proximal ascending aorta  Pericardium: Normal  IMPRESSION: 1. Asymmetric LV hypertrophy, measuring up to 13mm in basal septum (10mm in posterior wall), not meeting criteria for hypertrophic cardiomyopathy (<71mm)  2. RV insertion site late gadolinium enhancement, which is a nonspecific findings often seen in setting of elevated pulmonary pressures  3.  Normal LV size and systolic function (EF 64%)  4.  Normal RV size with mild systolic dysfunction (EF 45%)  5.  Mild mitral regurgitation (regurgitant fraction 11%)   Electronically Signed By: Epifanio Lesches M.D. On: 08/25/2022 16:21          Physical Exam:    VS:  BP 120/84 (BP Location: Left Arm, Patient Position: Sitting, Cuff Size: Normal)   Pulse 62   Resp 16   Ht 5\' 11"  (1.803 m)   Wt 191 lb 9.6 oz (86.9 kg)   BMI 26.72 kg/m    Wt Readings from Last 3 Encounters:  01/02/23 191 lb 9.6 oz (86.9 kg)  08/31/22 188 lb 12.8 oz (85.6  kg)  08/25/22 184 lb (83.5 kg)    Gen: no distress, Neck: No JVD Ears: no Homero Fellers Sign Cardiac: No Rubs or Gallops, harsh systolic murmur worse with handgrip, Valsalva, and standing Respiratory: Clear to auscultation bilaterally, normal effort, normal  respiratory rate GI: Soft, nontender, non-distended  MS: No  edema;  moves all extremities Integument: Skin feels warm Neuro:  At time of evaluation, alert and oriented to person/place/time/situation  Psych: Normal affect, patient feels nervous   ASSESSMENT AND PLAN: .    Obstructive Hypertrophic Cardiomyopathy - Symptomatic obstructive hypertrophic cardiomyopathy (HCM) with exercise, managed with metoprolol. Symptoms include lightheadedness, dizziness, fatigue, and shortness of breath likely due to deconditioning. Low risk for sudden cardiac death due to absence of  high-risk features. Discussed genetic testing for family screening and importance of periodic monitoring. - Continue metoprolol - Encourage gradual increase in exercise starting at 20 minutes, aiming for 60 minutes, ensuring the ability to speak during exercise (RESET-HCM protocol) - Refer to clinical geneticist for genetic testing - 6 months f/u may get  - Consider switch to calcium channel blocker if fatigue persists without other symptoms (diltiazem 120 mg PO XL) - Advise smaller meals and avoiding exercise immediately after eating - if near syncope re-occurs, will bring back for sx discussion, currently NYHA II and feels ok -   Hyperlipidemia Slightly elevated cholesterol levels in July. Emphasized lifestyle modifications to manage cholesterol and prevent atherosclerosis. Previous addition of ezetimibe significantly lowered cholesterol levels. 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  Time Spent Directly with Patient:   I have spent a total of 58 minutes with the patient reviewing notes, imaging, EKGs, labs, reviewing prior studies with patient and wife and examining the patient as well as establishing an assessment and plan that was discussed personally with the patient. Discussed disease state education.  Answered questions about GLP1-RA    Riley Lam, MD FASE [facility] Cardiologist [place]  [address] [place], [address] [place], [place] [address] [phone]  5:09 PM

## 2023-01-02 NOTE — Patient Instructions (Signed)
Medication Instructions:  Your physician recommends that you continue on your current medications as directed. Please refer to the Current Medication list given to you today.  *If you need a refill on your cardiac medications before your next appointment, please call your pharmacy*   Lab Work: NONE If you have labs (blood work) drawn today and your tests are completely normal, you will receive your results only by: MyChart Message (if you have MyChart) OR A paper copy in the mail If you have any lab test that is abnormal or we need to change your treatment, we will call you to review the results.   Testing/Procedures: Your physician has requested that you see a Dentist for Hypertrophic CArdiomyopathy.    Follow-Up: At [facility], you and your health needs are our priority.  As part of our continuing mission to provide you with exceptional heart care, we have created designated Provider Care Teams.  These Care Teams include your primary Cardiologist (physician) and Advanced Practice Providers (APPs -  Physician Assistants and Nurse Practitioners) who all work together to provide you with the care you need, when you need it.   Your next appointment:   6 month(s)  Provider:   Riley Lam, MD

## 2023-01-05 ENCOUNTER — Telehealth: Payer: BC Managed Care – PPO | Admitting: Physician Assistant

## 2023-01-05 DIAGNOSIS — L271 Localized skin eruption due to drugs and medicaments taken internally: Secondary | ICD-10-CM

## 2023-01-05 NOTE — Progress Notes (Signed)
Virtual Visit Consent   Kurt Walker, you are scheduled for a virtual visit with a [facility] provider today. Just as with appointments in the office, your consent must be obtained to participate. Your consent will be active for this visit and any virtual visit you may have with one of our providers in the next 365 days. If you have a MyChart account, a copy of this consent can be sent to you electronically.  As this is a virtual visit, video technology does not allow for your provider to perform a traditional examination. This may limit your provider's ability to fully assess your condition. If your provider identifies any concerns that need to be evaluated in person or the need to arrange testing (such as labs, EKG, etc.), we will make arrangements to do so. Although advances in technology are sophisticated, we cannot ensure that it will always work on either your end or our end. If the connection with a video visit is poor, the visit may have to be switched to a telephone visit. With either a video or telephone visit, we are not always able to ensure that we have a secure connection.  By engaging in this virtual visit, you consent to the provision of healthcare and authorize for your insurance to be billed (if applicable) for the services provided during this visit. Depending on your insurance coverage, you may receive a charge related to this service.  I need to obtain your verbal consent now. Are you willing to proceed with your visit today? Kurt Walker has provided verbal consent on 01/05/2023 for a virtual visit (video or telephone). Kurt Walker, [place]  Date: 01/05/2023 7:25 PM  Virtual Visit via Video Note   I, Kurt Walker, connected with  Kurt Walker  ([id number], [birth year]) on 01/05/23 at  7:15 PM EST by a video-enabled telemedicine application and verified that I am speaking with the correct person using two identifiers.  Location: Patient: Virtual Visit Location Patient:  Home Provider: Virtual Visit Location Provider: Home Office   I discussed the limitations of evaluation and management by telemedicine and the availability of in person appointments. The patient expressed understanding and agreed to proceed.    History of Present Illness: Kurt Walker is a 57 y.o. who identifies as a male who was assigned male at birth, and is being seen today for lesion/swelling on forehead after taking amoxicillin for suspected dental infection. No fever, no chills, no shortness of breath, no rash, uriticaria, hx of the same or any additional symptoms. Marland Kitchen  HPI: Rash This is a new problem. The current episode started today. Location: right forehead. Rash characteristics: none. He was exposed to a new medication. Pertinent negatives include no anorexia, congestion, cough, diarrhea, eye pain, facial edema, fatigue, fever, joint pain, nail changes, rhinorrhea, shortness of breath, sore throat or vomiting. Past treatments include nothing.    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 SWALLOW WHOLE., Disp: 90 tablet, Rfl: 0   co-enzyme Q-10 30 MG capsule, Take 30 mg by mouth  daily., Disp: , Rfl:    ezetimibe (ZETIA) 10 MG tablet, Take 10 mg by mouth daily., Disp: , Rfl:    metoprolol succinate (TOPROL-XL) 25 MG 24 hr tablet, TAKE 1 TABLET (25 MG TOTAL) BY MOUTH DAILY., Disp: 90 tablet, Rfl: 3   mometasone (NASONEX) 50 MCG/ACT nasal spray, Place 1 spray into the nose daily., Disp: , Rfl:    omeprazole (PRILOSEC) 20 MG capsule, Take 20 mg by mouth daily. (Patient not taking: Reported on 01/02/2023), Disp: , Rfl:    rosuvastatin (CRESTOR) 20 MG tablet, Take 1 tablet (20 mg total) by mouth daily., Disp: 90 tablet, Rfl: 3   valACYclovir (VALTREX) 500 MG tablet, Take 500 mg by mouth daily as needed., Disp: , Rfl:   Observations/Objective: Patient is well-developed, well-nourished in no acute distress.  Resting comfortably  at home.  Head is normocephalic, atraumatic.  No labored breathing.  Speech is clear and coherent with logical content.  Patient is alert and oriented at baseline.  Enlarged mobile area noted laterally to right eyebrow. No erythema, open/exposed skin or redness.   Assessment and Plan: 1. Fixed drug eruption (Primary)  Patient presenting with abnormal raised area after taking amoxicillin as prescribed by dentist. Considered lipoma and trauma but seems unlikely with history. Counseled to take antihistamine and h+ blocker, monitor symptoms and follow up with dentist/PCP. Counseled on signs of anaphylaxis and made aware of when to seek emergent care should his symptoms worsen. Patient verbalized understanding and is in agreement with plan.   Follow Up Instructions: I discussed the assessment and treatment plan with the patient. The patient was provided an opportunity to ask questions and all were answered. The patient agreed with the plan and demonstrated an understanding of the instructions.  A copy of instructions were sent to the patient via MyChart unless otherwise noted below.     The patient was advised to call back or seek an in-person  evaluation if the symptoms worsen or if the condition fails to improve as anticipated.    Kurt Kidney, PA-C

## 2023-01-05 NOTE — Patient Instructions (Signed)
  [name], thank you for joining [name], PA-C for today's virtual visit.  While this provider is not your primary care provider (PCP), if your PCP is located in our provider database this encounter information will be shared with them immediately following your visit.   A [facility] MyChart account gives you access to today's visit and all your visits, tests, and labs performed at [facility] " click here if you don't have a [facility] MyChart account or go to [email or web address]  Consent: (Patient) Kurt Walker provided verbal consent for this virtual visit at the beginning of the encounter.  Current Medications:  Current Outpatient Medications:    acetaminophen (TYLENOL) 500 MG tablet, Take 1,000 mg by mouth every 6 (six) hours as needed., Disp: , Rfl:    ADVAIR DISKUS 250-50 MCG/DOSE AEPB, USE ONE INHALATION DAILY (DISCARD 30 DAYS AFTER OPENING) RINSE MOUTH, Disp: 3 each, Rfl: 0   aspirin EC (ASPIRIN LOW DOSE) 81 MG tablet, Take 1 tablet (81 mg total) by mouth daily. SWALLOW WHOLE., Disp: 90 tablet, Rfl: 0   co-enzyme Q-10 30 MG capsule, Take 30 mg by mouth daily., Disp: , Rfl:    ezetimibe (ZETIA) 10 MG tablet, Take 10 mg by mouth daily., Disp: , Rfl:    metoprolol succinate (TOPROL-XL) 25 MG 24 hr tablet, TAKE 1 TABLET (25 MG TOTAL) BY MOUTH DAILY., Disp: 90 tablet, Rfl: 3   mometasone (NASONEX) 50 MCG/ACT nasal spray, Place 1 spray into the nose daily., Disp: , Rfl:    omeprazole (PRILOSEC) 20 MG capsule, Take 20 mg by mouth daily. (Patient not taking: Reported on 01/02/2023), Disp: , Rfl:    rosuvastatin (CRESTOR) 20 MG tablet, Take 1 tablet (20 mg total) by mouth daily., Disp: 90 tablet, Rfl: 3   valACYclovir (VALTREX) 500 MG tablet, Take 500 mg by mouth daily as needed., Disp: , Rfl:    Medications ordered in this encounter:  No orders of the defined types were placed in this encounter.    *If you need refills on other medications prior to your  next appointment, please contact your pharmacy*  Follow-Up: Call back or seek an in-person evaluation if the symptoms worsen or if the condition fails to improve as anticipated.  [facility] [phone]  Other Instructions Follow up with Primary and Dental provider tomorrow. Report to ER if symptoms worsen.    If you have been instructed to have an in-person evaluation today at a local Urgent Care facility, please use the link below. It will take you to a list of all of our available [facility], including address, phone number and hours of operation. Please do not delay care.  [facility]  If you or a family member do not have a primary care provider, use the link below to schedule a visit and establish care. When you choose a [facility] primary care physician or advanced practice provider, you gain a long-term partner in health. Find a Primary Care Provider  Learn more about [facility] in-office and virtual care options: [facility] - Get Care Now

## 2023-06-22 ENCOUNTER — Other Ambulatory Visit: Payer: Self-pay

## 2023-06-22 ENCOUNTER — Telehealth: Payer: Self-pay | Admitting: Internal Medicine

## 2023-06-22 DIAGNOSIS — E785 Hyperlipidemia, unspecified: Secondary | ICD-10-CM

## 2023-06-22 NOTE — Telephone Encounter (Signed)
 Patient wants to get orders for lab work to be done prior to visit on 6/9.

## 2023-06-22 NOTE — Telephone Encounter (Signed)
 Jann Melody, MD  You2 minutes ago (2:38 PM)   [facility] Can get fasting lipids if no recent. For his HOCM care I do not plan to get labs unless her has new symptoms.   Pt advised and will have labs drawn 06/23/23.

## 2023-06-23 LAB — LIPID PANEL
Chol/HDL Ratio: 2.3 ratio (ref 0.0–5.0)
Cholesterol, Total: 138 mg/dL (ref 100–199)
HDL: 61 mg/dL (ref >39)
LDL Chol Calc (NIH): 66 mg/dL (ref 0–99)
Triglycerides: 52 mg/dL (ref 0–149)
VLDL Cholesterol Cal: 11 mg/dL (ref 5–40)

## 2023-06-26 ENCOUNTER — Ambulatory Visit: Payer: BC Managed Care – PPO | Attending: Internal Medicine | Admitting: Internal Medicine

## 2023-06-26 ENCOUNTER — Encounter: Payer: Self-pay | Admitting: Internal Medicine

## 2023-06-26 ENCOUNTER — Ambulatory Visit: Payer: Self-pay

## 2023-06-26 VITALS — BP 118/78 | HR 63 | Ht 72.0 in | Wt 197.0 lb

## 2023-06-26 DIAGNOSIS — I421 Obstructive hypertrophic cardiomyopathy: Secondary | ICD-10-CM

## 2023-06-26 DIAGNOSIS — I2584 Coronary atherosclerosis due to calcified coronary lesion: Secondary | ICD-10-CM | POA: Diagnosis not present

## 2023-06-26 DIAGNOSIS — I4729 Other ventricular tachycardia: Secondary | ICD-10-CM

## 2023-06-26 DIAGNOSIS — I2510 Atherosclerotic heart disease of native coronary artery without angina pectoris: Secondary | ICD-10-CM | POA: Diagnosis not present

## 2023-06-26 NOTE — Progress Notes (Signed)
 Cardiology Office Note:  .    Date:  06/26/2023  ID:  Kurt Walker, DOB 1965/02/28, MRN [id number] PCP: Jimmey Mould, MD  [facility] Providers Cardiologist:  None     CC: HCM follow up care  History of Present Illness: .    Kurt Walker is a 58 y.o. male with hypertrophic obstructive cardiomyopathy who presents for a follow-up visit.  He has a history of hypertrophic obstructive cardiomyopathy and is currently on beta-blockade therapy, which has improved his symptoms. He feels generally well, although he experiences some difficulty transitioning back onto the medication after a brief discontinuation. He occasionally feels winded, attributing this to a combination of his age, deconditioning, and cardiomyopathy. He is active in his daily life, walking frequently for work, but is not engaging in structured exercise and notes he is heavier than he has ever been. He wants to manage his weight better but acknowledges poor dietary habits.  He has a history of hypertension and hyperlipidemia, with his cholesterol levels currently well-controlled. He is pleased with his cholesterol management and reports that his blood pressure seems fine.  Family history is significant for hypertrophic cardiomyopathy screening in his children. His 42 year old and 81 year old children are undergoing regular screening, while his 89-year-old twins have had echocardiograms and will be monitored more closely as they approach their teenage years. He has discussed genetic testing with his wife.  He reports a family history of heart disease, with his mother having died of a heart attack and his father and sister having had strokes. He occasionally experiences 'twinges' in his head, which he is concerned about due to his family history of strokes.  He is currently on aspirin  due to a past finding of coronary artery calcifications. He has experienced elevated cardiac enzymes in the past.  Discussed the use  of AI scribe software for clinical note transcription with the patient, who gave verbal consent to proceed.   Relevant histories: .  Social: 2024: Seen by [place] group: found to have an color flow LVOT gradient.  CMR was performed; in my review, Basal sepal measured 15 mm.  RV was found to be low normal. Found to have an inducible obstruction Married, works at [employer]. Friend with Larinda Plover from our admin team.  Mother had early CAD in the setting of chronic smoking; she has an autopsy proven MI at age 17. Sister has TIA and strokes without HCM or AF. One son had had omphalocele and is no longer with us . Has a 57, 67, and twin 57 year old boys.- all but the 57 year old have been screened. ROS: As per HPI.   Studies Reviewed: .     Cardiac Studies & Procedures   ______________________________________________________________________________________________   STRESS TESTS  ECHOCARDIOGRAM STRESS TEST 09/23/2022  Narrative EXERCISE STRESS ECHO REPORT   --------------------------------------------------------------------------------  Patient Name:   Kurt Walker Date of Exam: 09/23/2022 Medical Rec #:  [id number]      Height:       71.0 in Accession #:    [id number]     Weight:       188.8 lb Date of Birth:  26-Jul-1965     BSA:          2.058 m Patient Age:    56 years       BP:           138/92 mmHg Patient Gender: M              HR:  54 bpm. Exam Location:  [facility]  Procedure: Stress Echo, Limited Echo, Limited Color Doppler and Cardiac Doppler  Indications:    HOCM  History:        Patient has prior history of Echocardiogram examinations, most recent 08/24/2022.  Sonographer:    Joleen Navy RDCS Referring Phys: [id number] [facility] A Shawne Eskelson  IMPRESSIONS   1. The findings of the study demonstrate that a stress-induced outflow tract obstruction is present (peak LVOT gradient of at least 61 mmHG). There was a maximum systolic gradient of but cannot be  sure if this gradient is contaminated with mitral regurgitation. 2. At baseline, trivial mitral regurgitation was present but not visualized on post stress images. 3. This is an inconclusive stress echocardiogram for ischemia. Non-diagnostic study for ischemia due to Stress test done for HOCM not for ischemia. 4. This is an indeterminate risk study.  FINDINGS  Exam Protocol: The patient exercised on a treadmill according to a Bruce protocol.   Patient Performance: The patient exercised for 9 minutes achieving 10.1 METS. The maximum stage achieved was III of the Bruce protocol. The heart rate at peak stress was 160 bpm. The target heart rate was calculated to be 140 bpm. The percentage of maximum predicted heart rate achieved was 97.4 %. The baseline blood pressure was 138/92 mmHg. The blood pressure at peak stress was 216/81 mmHg. The blood pressure response was hypertensive. The patient developed shortness of breath and fatigue during the stress exam.  EKG: Resting EKG showed normal sinus rhythm with no abnormal findings. The patient developed Nonspecific ST abnormality during stress during exercise.   2D Echo Findings: The baseline ejection fraction was 60%. The peak ejection fraction at stress was 80%. Baseline regional wall motion abnormalities were not present. This is an inconclusive stress echocardiogram for ischemia. This is a non-diagnostic study for ischemia due to Stress test done for HOCM not for ischemia.  Stress Doppler:  LVOT: The baseline LVOT gradient was 8 mmHG. The peak LVOT gradient at stress was 90 mmHG. The findings of the study demonstrate that a stress-induced outflow tract obstruction is present.  Mitral Regurgitation: At baseline, trivial mitral regurgitation was present.   Gaylyn Keas MD Electronically signed on 09/23/2022 at 4:27:59 PM     Final   ECHOCARDIOGRAM  ECHOCARDIOGRAM COMPLETE 08/24/2022  Narrative ECHOCARDIOGRAM REPORT    Patient Name:    Kurt Walker Date of Exam: 08/24/2022 Medical Rec #:  [id number]      Height:       71.0 in Accession #:    [id number]     Weight:       182.8 lb Date of Birth:  1965-11-05     BSA:          2.029 m Patient Age:    56 years       BP:           121/80 mmHg Patient Gender: M              HR:           57 bpm. Exam Location:  Inpatient  Procedure: 2D Echo, Color Doppler, Cardiac Doppler and Intracardiac Opacification Agent  Indications:    R55 Syncope  History:        Patient has prior history of Echocardiogram examinations, most recent 09/03/2019. Risk Factors:Hypertension and Dyslipidemia.  Sonographer:    Sherline Distel Senior RDCS Referring Phys: Gayl Katos MELVIN  IMPRESSIONS   1. Moderate basal septal hypertrophy with turbulent  flow in the LVOT and SAM with peak LVOT gradient of with Valsalva. Findings suggestive of HOCM. Aaron Aas Left ventricular ejection fraction, by estimation, is 65 to 70%. The left ventricle has normal function. The left ventricle has no regional wall motion abnormalities. There is moderate left ventricular hypertrophy of the basal-septal segment. Left ventricular diastolic parameters are indeterminate. The average left ventricular global longitudinal strain is -19.3 %. The global longitudinal strain is normal. 2. Right ventricular systolic function is normal. The right ventricular size is normal. 3. The mitral valve is normal in structure. Trivial mitral valve regurgitation. No evidence of mitral stenosis. 4. The aortic valve is tricuspid. Aortic valve regurgitation is not visualized. Aortic valve sclerosis/calcification is present, without any evidence of aortic stenosis. Aortic valve area, by VTI measures 3.48 cm. Aortic valve mean gradient measures 11.0 mmHg. Aortic valve Vmax measures 1.88 m/s. 5. The inferior vena cava is normal in size with greater than 50% respiratory variability, suggesting right atrial pressure of 3 mmHg. 6. Consider cardiac MRI to evaluate  for HOCM.  FINDINGS Left Ventricle: Moderate basal septal hypertrophy with turbulent flow in the LVOT and SAM with peak LVOT gradient of with Valsalva. Findings suggestive of HOCM.Consider cardiac MRI to rule out HOCM. Left ventricular ejection fraction, by estimation, is 65 to 70%. The left ventricle has normal function. The left ventricle has no regional wall motion abnormalities. Definity  contrast agent was given IV to delineate the left ventricular endocardial borders. The average left ventricular global longitudinal strain is -19.3 %. The global longitudinal strain is normal. The left ventricular internal cavity size was normal in size. There is moderate left ventricular hypertrophy of the basal-septal segment. Left ventricular diastolic parameters are indeterminate. Normal left ventricular filling pressure.  Right Ventricle: The right ventricular size is normal. No increase in right ventricular wall thickness. Right ventricular systolic function is normal.  Left Atrium: Left atrial size was normal in size.  Right Atrium: Right atrial size was normal in size.  Pericardium: There is no evidence of pericardial effusion.  Mitral Valve: The mitral valve is normal in structure. Trivial mitral valve regurgitation. No evidence of mitral valve stenosis.  Tricuspid Valve: The tricuspid valve is normal in structure. Tricuspid valve regurgitation is not demonstrated. No evidence of tricuspid stenosis.  Aortic Valve: The aortic valve is tricuspid. Aortic valve regurgitation is not visualized. Aortic valve sclerosis/calcification is present, without any evidence of aortic stenosis. Aortic valve mean gradient measures 11.0 mmHg. Aortic valve peak gradient measures 14.1 mmHg. Aortic valve area, by VTI measures 3.48 cm.  Pulmonic Valve: The pulmonic valve was normal in structure. Pulmonic valve regurgitation is trivial. No evidence of pulmonic stenosis.  Aorta: The aortic root is normal in size  and structure.  Venous: The inferior vena cava is normal in size with greater than 50% respiratory variability, suggesting right atrial pressure of 3 mmHg.  IAS/Shunts: No atrial level shunt detected by color flow Doppler.   LEFT VENTRICLE PLAX 2D LVIDd:         4.10 cm   Diastology LVIDs:         2.60 cm   LV e' medial:    7.51 cm/s LV PW:         1.00 cm   LV E/e' medial:  9.0 LV IVS:        1.00 cm   LV e' lateral:   7.40 cm/s LVOT diam:     2.20 cm   LV E/e' lateral: 9.2 LV  SV:         149 LV SV Index:   73        2D Longitudinal Strain LVOT Area:     3.80 cm  2D Strain GLS (A2C):   -19.7 % 2D Strain GLS (A3C):   -20.2 % 2D Strain GLS (A4C):   -17.8 % 2D Strain GLS Avg:     -19.3 %  RIGHT VENTRICLE TAPSE (M-mode): 2.6 cm  LEFT ATRIUM             Index        RIGHT ATRIUM           Index LA diam:        3.10 cm 1.53 cm/m   RA Area:     16.70 cm LA Vol (A2C):   62.2 ml 30.65 ml/m  RA Volume:   40.50 ml  19.96 ml/m LA Vol (A4C):   39.6 ml 19.51 ml/m LA Biplane Vol: 49.9 ml 24.59 ml/m AORTIC VALVE AV Area (Vmax):    3.40 cm AV Area (Vmean):   3.15 cm AV Area (VTI):     3.48 cm AV Vmax:           188.00 cm/s AV Vmean:          163.000 cm/s AV VTI:            0.428 m AV Peak Grad:      14.1 mmHg AV Mean Grad:      11.0 mmHg LVOT Vmax:         168.00 cm/s LVOT Vmean:        135.000 cm/s LVOT VTI:          0.392 m LVOT/AV VTI ratio: 0.92  AORTA Ao Root diam: 3.30 cm Ao Asc diam:  3.40 cm  MITRAL VALVE MV Area (PHT): 2.53 cm    SHUNTS MV Decel Time: 300 msec    Systemic VTI:  0.39 m MV E velocity: 67.90 cm/s  Systemic Diam: 2.20 cm MV A velocity: 82.50 cm/s MV E/A ratio:  0.82  Sophia Dustman Turner MD Electronically signed by Gaylyn Keas MD Signature Date/Time: 08/24/2022/4:57:56 PM    Final    MONITORS  LONG TERM MONITOR (3-14 DAYS) 09/15/2022  Narrative   Patient had a minimum heart rate of 43 bpm, maximum heart rate of 156 bpm, and average heart rate  of 63 bpm.   Predominant underlying rhythm was Sinus rhythm.   Two short, 4 beat runs of SVT.   One run of NSVT 156 bpm (4 beats).   A NSVT run of 9.5 seconds is 115 bpm.  This QRS may represent artifact.   Isolated PACs were rare (<1.0%).   Isolated PVCs were rare (<1.0%).   Triggered and diary events associated with sinus bradycardia.  Very rare NSVT and possible artifact.     CARDIAC MRI  MR CARDIAC MORPHOLOGY W WO CONTRAST 08/25/2022  Narrative CLINICAL DATA:  18M presents with presyncope  EXAM: CARDIAC MRI  TECHNIQUE: The patient was scanned on a 1.5 Tesla Siemens magnet. A dedicated cardiac coil was used. Functional imaging was done using Fiesta sequences. 2,3, and 4 chamber views were done to assess for RWMA's. Modified Simpson's rule using a short axis stack was used to calculate an ejection fraction on a dedicated work [employer]. The patient received 8 cc of Gadavist . After 10 minutes inversion recovery sequences were used to assess for infiltration and scar tissue. Phase contrast velocity mapping was  performed above the aortic and pulmonic valves  CONTRAST:  8 cc  of Gadavist   FINDINGS: Left ventricle:  -Asymmetric hypertrophy, measuring up to 13mm in basal septum (10mm in posterior wall), not meeting criteria for hypertrophic cardiomyopathy (<4mm)  -Normal size  -Normal systolic function  -Mild ECV elevation (29%)  -RV insertion site LGE  LV EF: 64% (Normal 49-79%)  Absolute volumes:  LV EDV: (Normal 95-215 mL)  LV ESV: 45mL (Normal 25-85 mL)  LV SV: 79mL (Normal 61-145 mL)  CO: 4.7L/min (Normal 3.4-7.8 L/min)  Indexed volumes:  LV EDV: 67mL/sq-m (Normal 50-108 mL/sq-m)  LV ESV: 8mL/sq-m (Normal 11-47 mL/sq-m)  LV SV: 51mL/sq-m (Normal 33-72 mL/sq-m)  CI: 2.3L/min/sq-m (Normal 1.8-4.2 L/min/sq-m)  Right ventricle: Normal size with mild systolic dysfunction  RV EF:  04% (Normal 51-80%)  Absolute  volumes:  RV EDV: (Normal 109-217 mL)  RV ESV: 64mL (Normal 23-91 mL)  RV SV: 53mL (Normal 71-141 mL)  CO: 3.2L/min (Normal 2.8-8.8 L/min)  Indexed volumes:  RV EDV: 36mL/sq-m (Normal 58-109 mL/sq-m)  RV ESV: 60mL/sq-m (Normal 12-46 mL/sq-m)  RV SV: 84mL/sq-m (Normal 38-71 mL/sq-m)  CI: 1.6L/min/sq-m (Normal 1.7-4.2 L/min/sq-m)  Left atrium: Normal size  Right atrium: Normal size  Mitral valve: Mild regurgitation (regurgitant fraction 11%)  Aortic valve: Trivial regurgitation.  Tricuspid  Tricuspid valve: Trivial regurgitation  Pulmonic valve: Trivial regurgitation  Aorta: Normal proximal ascending aorta  Pericardium: Normal  IMPRESSION: 1. Asymmetric LV hypertrophy, measuring up to 13mm in basal septum (10mm in posterior wall), not meeting criteria for hypertrophic cardiomyopathy (<29mm)  2. RV insertion site late gadolinium enhancement, which is a nonspecific findings often seen in setting of elevated pulmonary pressures  3.  Normal LV size and systolic function (EF 64%)  4.  Normal RV size with mild systolic dysfunction (EF 45%)  5.  Mild mitral regurgitation (regurgitant fraction 11%)   Electronically Signed By: Carson Clara M.D. On: 08/25/2022 16:21   ______________________________________________________________________________________________       Physical Exam:    VS:  BP 118/78 (BP Location: Left Arm)   Pulse 63   Ht 6' (1.829 m)   Wt 197 lb (89.4 kg)   SpO2 97%   BMI 26.72 kg/m    Wt Readings from Last 3 Encounters:  06/26/23 197 lb (89.4 kg)  01/02/23 191 lb 9.6 oz (86.9 kg)  08/31/22 188 lb 12.8 oz (85.6 kg)    Gen: no distress  Neck: No JVD Cardiac: No Rubs or Gallops, systolic murmur, RRR +2 radial pulses Respiratory: Clear to auscultation bilaterally, normal effort, normal  respiratory rate GI: Soft, nontender, non-distended  MS: No  edema;  moves all extremities Integument: Skin feels warm Neuro:  At  time of evaluation, alert and oriented to person/place/time/situation  Psych: Normal affect, patient feels ok   ASSESSMENT AND PLAN: .    Hypertrophic Cardiomyopathy - Obstructive Variant - peak gradient 50 in 2024 with exercise induced MR - hs troponin 50-70s - suspicion of Fabry's/Danon/Noonan's or other mimics of HCM: low - Gene variant: Pending assessment - NYHA II - Hypertrophic obstructive cardiomyopathy (HOCM) with NYHA class II symptoms, including mild exertional dyspnea, likely due to HOCM, deconditioning, and age. Echocardiogram from September 2024 showed severe LVOT gradient. Low risk of sudden cardiac death. Discussed genetic testing for familial risk assessment, emphasizing its utility for children's screening. Genetic testing could clarify genotype status, influencing offspring screening protocols. Discussed lifestyle modifications, including diet and exercise, as part of conservative management. Potential for more aggressive  treatment if symptoms worsen or lifestyle changes are insufficient. Discussed possibility of repeating stress tests to monitor LVOT gradient and symptoms. - Order squat stand echocardiogram in November 2025 - Refer to cardiac genetics for genetic testing - Encourage lifestyle modifications including diet and exercise; we reviewed AI diet planning with him and his wife - Consider more aggressive treatment if symptoms worsen - Repeat stress test if symptoms persist  Family history reviewed, Discussed family screening  - Refer to cardiac genetics for genetic testing (Dr. Drexel Gentles- [facility])  SCD  Assessment - Exercise testing normal for normal rhythm no change in blood pressure or syncope on exercise testing) - CMR from 2024 notable for no scar - rare NSVT and SVT- will repeat testing in 2026 Based on the absence of risk factors, this patient does not have an indication for an ICD (Class 3 - No Benefit)  Supraventricular and non-sustained ventricular  tachycardia Supraventricular and non-sustained ventricular tachycardia with previous heart monitor showing rare supraventricular tachycardia and very rare non-sustained ventricular tachycardia. No evidence of atrial fibrillation or flutter. Low risk of stroke based on current findings. - Repeat heart monitor in 2026  Coronary artery disease with hyperlipidemia Coronary artery disease with well-controlled hyperlipidemia. Current cholesterol levels are satisfactory. Continued emphasis on lifestyle modifications, including dietary improvements, to support cardiovascular health. - Continue current lipid management regimen - Encourage dietary improvements  Hypertension Hypertension is well-managed with existing treatment regimen. Blood pressure is stable. - Continue current antihypertensive regimen   Longitudinal care: The evaluation and management services provided today reflect the complexity inherent in caring for this patient, including the ongoing longitudinal relationship and management of multiple chronic conditions and/or the need for care coordination. The visit required a comprehensive assessment and management plan tailored to the patient's unique needs Time was spent addressing not only the acute concerns but also the broader context of the patient's health, including preventive care, chronic disease management, and care coordination as appropriate.  Complex longitudinal is necessary for conditions including: discussed genetic testing for HCM; screening protocols for his children; plans for treatment if his NYHA Class II sx escalate   Gloriann Larger, MD FASE [facility] Cardiologist [facility]  [address] [place], [place] [address] [phone]  10:30 AM

## 2023-06-26 NOTE — Patient Instructions (Signed)
 Medication Instructions:  Your physician recommends that you continue on your current medications as directed. Please refer to the Current Medication list given to you today.  *If you need a refill on your cardiac medications before your next appointment, please call your pharmacy*  Lab Work: NONE  If you have labs (blood work) drawn today and your tests are completely normal, you will receive your results only by: MyChart Message (if you have MyChart) OR A paper copy in the mail If you have any lab test that is abnormal or we need to change your treatment, we will call you to review the results.  Testing/Procedures: NOV 2025- - - Your physician has requested that you have an echocardiogram- - - SQUAT to STAND. Echocardiography is a painless test that uses sound waves to create images of your heart. It provides your doctor with information about the size and shape of your heart and how well your heart's chambers and valves are working. This procedure takes approximately one hour. There are no restrictions for this procedure. Please do NOT wear cologne, perfume, aftershave, or lotions (deodorant is allowed). Please arrive 15 minutes prior to your appointment time.  Please note: We ask at that you not bring children with you during ultrasound (echo/ vascular) testing. Due to room size and safety concerns, children are not allowed in the ultrasound rooms during exams. Our front office staff cannot provide observation of children in our lobby area while testing is being conducted. An adult accompanying a patient to their appointment will only be allowed in the ultrasound room at the discretion of the ultrasound technician under special circumstances. We apologize for any inconvenience.   Follow-Up: At [facility], you and your health needs are our priority.  As part of our continuing mission to provide you with exceptional heart care, our providers are all part of one team.  This team  includes your primary Cardiologist (physician) and Advanced Practice Providers or APPs (Physician Assistants and Nurse Practitioners) who all work together to provide you with the care you need, when you need it.  Your next appointment:   1 year(s)  Provider:   Gloriann Larger, MD

## 2023-10-05 ENCOUNTER — Other Ambulatory Visit: Payer: Self-pay | Admitting: Cardiology

## 2023-10-05 MED ORDER — METOPROLOL SUCCINATE ER 25 MG PO TB24: 25.0000 mg | ORAL_TABLET | Freq: Every day | ORAL | 2 refills | Status: AC

## 2023-10-23 ENCOUNTER — Encounter: Payer: Self-pay | Admitting: Internal Medicine

## 2023-10-23 DIAGNOSIS — I421 Obstructive hypertrophic cardiomyopathy: Secondary | ICD-10-CM

## 2023-11-13 ENCOUNTER — Telehealth: Payer: Self-pay

## 2023-11-13 NOTE — Telephone Encounter (Signed)
   Pre-operative Risk Assessment    Patient Name: Kurt Walker  DOB: 25-Jul-1965 MRN: [id number]   Date of last office visit: 06/26/23 Date of next office visit: 11/28/23   Request for Surgical Clearance    Procedure:  Colonoscopy  Date of Surgery:  Clearance 01/15/24                                Surgeon:  Dr. Layla Lah Surgeon's Group or Practice Name:  [facility] Phone number:  [phone] Fax number:  [phone]   Type of Clearance Requested:   - Medical    Type of Anesthesia:  Propofol    Additional requests/questions:    Bonney Ival LOISE Gerome   11/13/2023, 3:35 PM

## 2023-11-14 ENCOUNTER — Telehealth (HOSPITAL_BASED_OUTPATIENT_CLINIC_OR_DEPARTMENT_OTHER): Payer: Self-pay | Admitting: *Deleted

## 2023-11-14 NOTE — Telephone Encounter (Signed)
   Name: Kurt Walker  DOB: [birth year]  MRN: [id number]  Primary Cardiologist: None   Preoperative team, please contact this patient and set up a phone call appointment for further preoperative risk assessment. Please obtain consent and complete medication review. Thank you for your help.  Schedule at least after pending echocardiogram on 11/27/2023.   I confirm that guidance regarding antiplatelet and oral anticoagulation therapy has been completed and, if necessary, noted below.  None requested.  I also confirmed the patient resides in the state of [place] . As per [facility] Medical Board telemedicine laws, the patient must reside in the state in which the provider is licensed.   Lum LITTIE Louis, NP 11/14/2023, 8:38 AM [facility]

## 2023-11-14 NOTE — Telephone Encounter (Signed)
 Left message to call back to schedule a tele pre op appt.  ?

## 2023-11-14 NOTE — Telephone Encounter (Signed)
 Pt returning call. Please advise.

## 2023-11-14 NOTE — Telephone Encounter (Signed)
 Pt has been scheduled tele preop appt 12/18/23. Med rec and consent are done.     Patient Consent for Virtual Visit        Kurt Walker has provided verbal consent on 11/14/2023 for a virtual visit (video or telephone).   CONSENT FOR VIRTUAL VISIT FOR:  Kurt Walker  By participating in this virtual visit I agree to the following:  I hereby voluntarily request, consent and authorize [facility] and its employed or contracted physicians, physician assistants, nurse practitioners or other licensed health care professionals (the Practitioner), to provide me with telemedicine health care services (the "Services) as deemed necessary by the treating Practitioner. I acknowledge and consent to receive the Services by the Practitioner via telemedicine. I understand that the telemedicine visit will involve communicating with the Practitioner through live audiovisual communication technology and the disclosure of certain medical information by electronic transmission. I acknowledge that I have been given the opportunity to request an in-person assessment or other available alternative prior to the telemedicine visit and am voluntarily participating in the telemedicine visit.  I understand that I have the right to withhold or withdraw my consent to the use of telemedicine in the course of my care at any time, without affecting my right to future care or treatment, and that the Practitioner or I may terminate the telemedicine visit at any time. I understand that I have the right to inspect all information obtained and/or recorded in the course of the telemedicine visit and may receive copies of available information for a reasonable fee.  I understand that some of the potential risks of receiving the Services via telemedicine include:  Delay or interruption in medical evaluation due to technological equipment failure or disruption; Information transmitted may not be sufficient (e.g. poor  resolution of images) to allow for appropriate medical decision making by the Practitioner; and/or  In rare instances, security protocols could fail, causing a breach of personal health information.  Furthermore, I acknowledge that it is my responsibility to provide information about my medical history, conditions and care that is complete and accurate to the best of my ability. I acknowledge that Practitioner's advice, recommendations, and/or decision may be based on factors not within their control, such as incomplete or inaccurate data provided by me or distortions of diagnostic images or specimens that may result from electronic transmissions. I understand that the practice of medicine is not an exact science and that Practitioner makes no warranties or guarantees regarding treatment outcomes. I acknowledge that a copy of this consent can be made available to me via my patient portal [facility] MyChart), or I can request a printed copy by calling the office of [facility].    I understand that my insurance will be billed for this visit.   I have read or had this consent read to me. I understand the contents of this consent, which adequately explains the benefits and risks of the Services being provided via telemedicine.  I have been provided ample opportunity to ask questions regarding this consent and the Services and have had my questions answered to my satisfaction. I give my informed consent for the services to be provided through the use of telemedicine in my medical care

## 2023-11-14 NOTE — Telephone Encounter (Signed)
 Pt has been scheduled tele preop appt 02/07/23. Med rec and consent are done.

## 2023-11-23 ENCOUNTER — Encounter: Payer: Self-pay | Admitting: Internal Medicine

## 2023-11-27 ENCOUNTER — Ambulatory Visit (HOSPITAL_COMMUNITY)
Admission: RE | Admit: 2023-11-27 | Discharge: 2023-11-27 | Disposition: A | Discharge status: Home / Self Care | Source: Ambulatory Visit | Attending: Cardiovascular Disease | Admitting: Cardiovascular Disease

## 2023-11-27 DIAGNOSIS — I2584 Coronary atherosclerosis due to calcified coronary lesion: Secondary | ICD-10-CM | POA: Insufficient documentation

## 2023-11-27 DIAGNOSIS — I421 Obstructive hypertrophic cardiomyopathy: Secondary | ICD-10-CM | POA: Insufficient documentation

## 2023-11-27 DIAGNOSIS — I4729 Other ventricular tachycardia: Secondary | ICD-10-CM | POA: Insufficient documentation

## 2023-11-27 DIAGNOSIS — I2510 Atherosclerotic heart disease of native coronary artery without angina pectoris: Secondary | ICD-10-CM | POA: Diagnosis present

## 2023-11-27 LAB — ECHOCARDIOGRAM SQUAT TO STAND
Area-P 1/2: 3.42 cm2
S' Lateral: 2.7 cm

## 2023-11-28 ENCOUNTER — Ambulatory Visit: Attending: Genetic Counselor | Admitting: Genetic Counselor

## 2023-11-28 DIAGNOSIS — I421 Obstructive hypertrophic cardiomyopathy: Secondary | ICD-10-CM

## 2023-12-01 ENCOUNTER — Telehealth: Payer: Self-pay | Admitting: Internal Medicine

## 2023-12-01 NOTE — Telephone Encounter (Signed)
 Patient notes that he is doing ok.   Notes that his heart symptoms NYHA II with full stomach.  No chest pain or pressure .  No SOB and no PND/Orthopnea.  No weight gain or leg swelling.  No palpitations or syncope.  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  Plan - Low MI risk for surgery, may needs IVC and rate control for anesthesia planning to decrease risk of obstructive physiology - Can hold ASA- reasonable to proceed with colonoscopy (please send copy of my note to his GI MD and team) - BP checks at home post coloscopy - June f/u with me unless worsening hypertension or increase in NYHA symptoms  Kurt Leavens, MD FASE [facility] Cardiologist [facility]  [address] [place], [place] [address] [phone]  6:05 PM

## 2023-12-04 NOTE — Telephone Encounter (Signed)
 Preoperative team,  Please forward Dr. Milan note to requesting office.  Thank you for your help.  Kurt Walker. Amiayah Giebel NP-C     12/04/2023, 12:54 PM [facility] [address] [place], [place] [address] Office [phone]

## 2023-12-04 NOTE — Telephone Encounter (Signed)
 Kurt Josefa HERO, NP    12/04/23 12:54 PM Note Preoperative team,   Please forward Dr. Milan note to requesting office.  Thank you for your help.   Josefa Walker. Cleaver NP-C      12/04/2023, 12:54 PM [facility] [address] [place], [place] [address] Office [phone]         12/01/23  6:05 PM Santo Stanly LABOR, MD routed this conversation to Parthenia Olivia HERO, PA-C  Cv Div Preop  Santo Stanly LABOR, MD [facility]   12/01/23  6:05 PM Note Patient notes that he is doing ok.   Notes that his heart symptoms NYHA II with full stomach.   No chest pain or pressure .  No SOB and no PND/Orthopnea.  No weight gain or leg swelling.  No palpitations or syncope.   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   Plan - Low MI risk for surgery, may needs IVC and rate control for anesthesia planning to decrease risk of obstructive physiology - Can hold ASA- reasonable to proceed with colonoscopy (please send copy of my note to his GI MD and team) - BP checks at home post coloscopy - June f/u with me unless worsening hypertension or increase in NYHA symptoms   Stanly Santo, MD FASE [facility] Cardiologist [facility]  [address] [place], [place] [address] [phone]  6:05 PM

## 2023-12-11 NOTE — Progress Notes (Signed)
 Pre Test Genetic Consult  Referral Reason  Kurt Walker, a new HCM patient, is referred for genetic consult and testing of hypertrophic cardiomyopathy.  This is a telemedicine visit. Patient identity is confirmed with two unique identifiers.   Personal Medical Information Kurt Walker (III.1 on pedigree) is a 58 year old Caucasian gentleman who works at [employer]. In 2024, he went to the ER after experiencing dizziness while walking up a flight of stairs. He was found to have elevated troponin levels and underwent cardiac imaging studies that were suggestive of HCM.   Cardiac MRI demonstrated basal septal hypertrophy measuring at 1.5 cm by his cardiologist, inducible obstruction and LVEF of 64%. No scar burden.  He reports having shortness of breath and fatigue that is exacerbated after having a full meal or with exertion. Also notes feeling dizzy after having a meal. Denies chest pains and syncope. He tells me of having heart palpitations for the last 1 year.  Traditional Risk Factors Kurt Walker was diagnosed with HTN about 10 years ago and states that it is well controlled with medication.  Family history  Relation to Proband Pedigree # Current age Heart condition/age of onset Notes  Sons, 5 IV.1-IV.5 Deceased 56, 22, 28,6 (fraternal twins) None IV.2- Died @ birth from omphalocele Normal Echo/EKG in 2025 - all sons        Sister III.2 53 None Echo/EKG to be done stroke  nieces IV.6, IV.7 26, 23 None         Father II.3 67 None Stroke @ 73  Paternal uncle II.2 48 M.I.   Paternal aunt II.1 Deceased None Died @ 78-M.I  Paternal grandfather 1.1 Deceased None Died @ 58 -old age  Paternal grandmother I.2 Deceased None Died @ 14- old age        Mother II.4 Deceased None Died @ 43- M.I.  Maternal grandfather I.3 Deceased None Died @ ? OF ?  Maternal grandmother I.4 Deceased None Died @ 46- old age   Genetics Kurt Walker was counseled on the genetics of hypertrophic cardiomyopathy (HCM). I  explained to the patient that this is an autosomal dominant condition with incomplete penetrance i.e. not all individuals harboring the HCM mutation will present clinically with HCM, and age-related penetrance where clinical presentation of HCM increases with advanced age. Variability in clinical expression is also seen in families with HCM with affected family members presenting clinically at different ages and with symptoms ranging from mild to severe.  Since HCM is an autosomal dominant condition, first degree-relatives are at a 50% risk of inheriting this condition. They should seek regular surveillance for HCM.  First-degree relatives include his sons and sister. At this time his nieces do not need to undergo screening- they can be screened if they are symptomatic or if their parent is found to have HCM.    Clinical screening of first-degree relatives involves echocardiogram and EKG at regular intervals, frequency is typically determined by age, with children undergoing screening every year until the age of 17 and those over the age of 59 getting screened every 3-5 years until the age of 61. Patient verbalized understanding of this.  Also briefly discussed the inheritance pattern and treatment /management plans for the infiltrative cardiomyopathies that present as HCM phenocopies. About 8-10% of HCM patients can have compound and digenic sarcomeric mutations for HCM  Patient should be aware that genetic testing is a probabilistic test dependent upon age and severity of presentation, presence of risk factors for HCM and importantly family history of HCM  or sudden death in first-degree relatives. The potential outcomes of genetic testing and subsequent management of at-risk family members is listed below-  If a mutation is not identified then it is important that he understands that HCM is a genetic condition and can be passed down to his children. All first-degree relatives should undergo regular  screening for HCM.  A negative test result can be due to limitations of the genetic test.   There is also the likelihood of identifying a "Variant of unknown significance". This result means that the variant has not been detected in a statistically significant number of HCM patients and/or functional studies have not been performed to verify its pathogenicity. This VUS can be tested in the family to see if it segregates with disease. If a VUS is found, first-degree relatives should undergo regular clinical screening for HCM, but genetic testing for the VUS is otherwise not warranted.  If a pathogenic variant is reported, then first-degree family members can get tested for this variant. If they test positive, it is likely they will develop HCM. In light of variable expression and incomplete penetrance associated with HCM, it is not possible to predict when they will manifest clinically with HCM. It is recommended that family members that test positive for the familial pathogenic variant pursue clinical screening for HCM. Family members that test negative for the familial mutation need not pursue periodic screening for HCM, but seek care if symptoms develop.   Impression  Kurt Walker was found to have cardiac wall thickness suggestive of HCM at age 30 in the absence of other cardiac loading conditions that can lead to cardiac hypertrophy. There is no family history of HCM or sudden death. It is likely he has a de novo mutation for HCM or has inherited this from one of his parents with evident reduced penetrance.  Genetic testing is recommended to confirm his diagnosis. This test should include the major sarcomeric genes involved in HCM, namely MYBPPC3, MYH7, TNNI3, TNNT2, TPM1, ACTC1, MYL2 and MYL3. It should also include the genes involved in HCM phenocopies as cardiac-predominant forms of these conditions present clinically as HCM. These include genes for Fabry disease (GLA), Danon disease (LAMP2), WPW syndrome  (PRKAG2), Familial transthyretin amyloidosis (TTR) and the genes definitively associated with HCM, namely phospholamban (PLN) and desmin (DES).  In addition, patient should be aware of protections afforded by the Genetic Information Non-Discrimination Act (GINA). GINA protects a patient from losing their employment or health insurance based on their genotype. However, these protections do not cover life insurance and disability. Explained to the patient that family members that are found to have the familial genetic mutation will be denied life insurance even if they are asymptomatic and do not exhibit clinical signs of HCM. Patient verbalized understanding and will discuss this with their family.   Please note that the patient has not been counseled in this visit on other personal, cultural or ethical issues that patient may face due to their heart condition.   Plan After a thorough discussion of the risk and benefits of genetic testing for HCM, Oluwadarasimi expresses interest in pursuing genetic testing for HCM. An order will be placed with [facility] that is in network with patient's insurance. Patient has been made aware that a saliva kit will be sent to them from the lab and to follow appropriate precautions while collecting their sample and for sending it back to the lab. Have advised patient to register with the lab and ensure that they communicate with  the lab regarding prior authorization and testing. Patient acknowledges this.    Danford Pac, Ph.D, [facility] Clinical Molecular Geneticist

## 2023-12-18 ENCOUNTER — Ambulatory Visit

## 2024-01-02 NOTE — Telephone Encounter (Signed)
 Requesting office sent note stating they need clarification on clearance for colonoscopy. Per note:   Message per anesthesia below:  Needs clarification for following  May need ivc and reate control for anesthesia planning to decrease risk of obstructive physiology   Procedure will be done as out patient at [facility] under propofol  sedation. No intubation. We have not way to control the rate and we do not know what  needs IVC mean  Notes stated that surgery scheduler has tried to call my ext [phone] and it keeps just ringing,. I am here at my desk pretty much all day, though most of the day is spent on the phone with pt's and surgeon's office . I do my best to try to answer all of the calls coming in.    Per notes this needs to clarified ASAP

## 2024-01-03 ENCOUNTER — Telehealth: Payer: Self-pay | Admitting: Internal Medicine

## 2024-01-03 NOTE — Telephone Encounter (Signed)
 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  Will confirm with Dr. Fairy and plan for imaging family follow up.  Stanly Leavens, MD FASE [facility] Cardiologist [facility]  [address] [place], [place] [address] [phone]  2:31 PM

## 2024-01-03 NOTE — Telephone Encounter (Signed)
 Helping in preop today. As below, anesthesia needs clarification on Dr. Milan recommendations.   Kurt Walker's note indicates that anesthesia was asking for clarification on 'May need ivc and reate control for anesthesia planning to decrease risk of obstructive physiology'.  Procedure will be done as out patient at [facility] under propofol  sedation. No intubation. We have not way to control the rate and we do not know what  needs IVC means.  Will route to Dr. Santo to review and provide further clarification. Dr. Hilarie - Please route response to P CV DIV PREOP (the pre-op pool). Thank you.

## 2024-01-03 NOTE — Telephone Encounter (Signed)
 Will route to callback to relay updated message from Dr. Santo to requesting team - IVC was meant to say IVF, and he references the clarification of rate control below. Dr. Santo indicates he has been corresponding with Dr. Elicia and his CRNA through staff messages about this patient. Please let preop team/Dr. Santo know if anything else needed.

## 2024-02-05 NOTE — Telephone Encounter (Signed)
 LVMTCB to sch post test f/u

## 2024-02-21 NOTE — Telephone Encounter (Signed)
 Called pt scheduled OV with Dr. Fairy 02/27/24 at 12 noon.

## 2024-02-27 ENCOUNTER — Ambulatory Visit: Admitting: Genetic Counselor
